# Patient Record
Sex: Female | Born: 1940
Health system: Southern US, Community
[De-identification: ages and names within clinical notes are randomized; demographics above are authoritative.]

## PROBLEM LIST (undated history)

## (undated) DIAGNOSIS — E782 Mixed hyperlipidemia: Secondary | ICD-10-CM

## (undated) DIAGNOSIS — E119 Type 2 diabetes mellitus without complications: Secondary | ICD-10-CM

## (undated) DIAGNOSIS — E039 Hypothyroidism, unspecified: Secondary | ICD-10-CM

## (undated) HISTORY — DX: Hypothyroidism, unspecified: E03.9

## (undated) HISTORY — DX: Type 2 diabetes mellitus without complications: E11.9

## (undated) HISTORY — PX: LAPAROSCOPIC CHOLECYSTECTOMY: SUR755

## (undated) HISTORY — DX: Mixed hyperlipidemia: E78.2

---

## 2014-11-09 DIAGNOSIS — H40013 Open angle with borderline findings, low risk, bilateral: Secondary | ICD-10-CM | POA: Diagnosis not present

## 2014-11-09 DIAGNOSIS — H2513 Age-related nuclear cataract, bilateral: Secondary | ICD-10-CM | POA: Diagnosis not present

## 2014-11-09 DIAGNOSIS — H04123 Dry eye syndrome of bilateral lacrimal glands: Secondary | ICD-10-CM | POA: Diagnosis not present

## 2014-11-09 DIAGNOSIS — E119 Type 2 diabetes mellitus without complications: Secondary | ICD-10-CM | POA: Diagnosis not present

## 2015-02-15 DIAGNOSIS — H40013 Open angle with borderline findings, low risk, bilateral: Secondary | ICD-10-CM | POA: Diagnosis not present

## 2015-04-28 DIAGNOSIS — I10 Essential (primary) hypertension: Secondary | ICD-10-CM | POA: Diagnosis not present

## 2015-04-28 DIAGNOSIS — E1165 Type 2 diabetes mellitus with hyperglycemia: Secondary | ICD-10-CM | POA: Diagnosis not present

## 2015-04-28 DIAGNOSIS — E039 Hypothyroidism, unspecified: Secondary | ICD-10-CM | POA: Diagnosis not present

## 2015-04-28 DIAGNOSIS — E119 Type 2 diabetes mellitus without complications: Secondary | ICD-10-CM | POA: Diagnosis not present

## 2015-06-22 DIAGNOSIS — H40013 Open angle with borderline findings, low risk, bilateral: Secondary | ICD-10-CM | POA: Diagnosis not present

## 2015-08-03 DIAGNOSIS — E039 Hypothyroidism, unspecified: Secondary | ICD-10-CM | POA: Diagnosis not present

## 2015-08-03 DIAGNOSIS — I10 Essential (primary) hypertension: Secondary | ICD-10-CM | POA: Diagnosis not present

## 2015-08-03 DIAGNOSIS — E1165 Type 2 diabetes mellitus with hyperglycemia: Secondary | ICD-10-CM | POA: Diagnosis not present

## 2015-08-07 DIAGNOSIS — Z6829 Body mass index (BMI) 29.0-29.9, adult: Secondary | ICD-10-CM | POA: Diagnosis not present

## 2015-08-07 DIAGNOSIS — Z2821 Immunization not carried out because of patient refusal: Secondary | ICD-10-CM | POA: Diagnosis not present

## 2015-08-07 DIAGNOSIS — E1165 Type 2 diabetes mellitus with hyperglycemia: Secondary | ICD-10-CM | POA: Diagnosis not present

## 2015-08-07 DIAGNOSIS — E039 Hypothyroidism, unspecified: Secondary | ICD-10-CM | POA: Diagnosis not present

## 2015-09-01 DIAGNOSIS — E782 Mixed hyperlipidemia: Secondary | ICD-10-CM | POA: Diagnosis not present

## 2015-09-01 DIAGNOSIS — E1165 Type 2 diabetes mellitus with hyperglycemia: Secondary | ICD-10-CM | POA: Diagnosis not present

## 2015-09-01 DIAGNOSIS — I10 Essential (primary) hypertension: Secondary | ICD-10-CM | POA: Diagnosis not present

## 2015-09-01 DIAGNOSIS — Z23 Encounter for immunization: Secondary | ICD-10-CM | POA: Diagnosis not present

## 2015-09-01 DIAGNOSIS — E039 Hypothyroidism, unspecified: Secondary | ICD-10-CM | POA: Diagnosis not present

## 2015-10-01 DIAGNOSIS — J019 Acute sinusitis, unspecified: Secondary | ICD-10-CM | POA: Diagnosis not present

## 2015-10-01 DIAGNOSIS — H9209 Otalgia, unspecified ear: Secondary | ICD-10-CM | POA: Diagnosis not present

## 2015-10-01 DIAGNOSIS — R030 Elevated blood-pressure reading, without diagnosis of hypertension: Secondary | ICD-10-CM | POA: Diagnosis not present

## 2015-10-01 DIAGNOSIS — M542 Cervicalgia: Secondary | ICD-10-CM | POA: Diagnosis not present

## 2015-10-05 DIAGNOSIS — R51 Headache: Secondary | ICD-10-CM | POA: Diagnosis not present

## 2015-10-12 DIAGNOSIS — E119 Type 2 diabetes mellitus without complications: Secondary | ICD-10-CM | POA: Diagnosis not present

## 2015-10-12 DIAGNOSIS — R5383 Other fatigue: Secondary | ICD-10-CM | POA: Diagnosis not present

## 2015-10-12 DIAGNOSIS — R51 Headache: Secondary | ICD-10-CM | POA: Diagnosis not present

## 2015-12-14 DIAGNOSIS — E782 Mixed hyperlipidemia: Secondary | ICD-10-CM | POA: Diagnosis not present

## 2015-12-14 DIAGNOSIS — E119 Type 2 diabetes mellitus without complications: Secondary | ICD-10-CM | POA: Diagnosis not present

## 2015-12-14 DIAGNOSIS — E039 Hypothyroidism, unspecified: Secondary | ICD-10-CM | POA: Diagnosis not present

## 2015-12-15 DIAGNOSIS — E782 Mixed hyperlipidemia: Secondary | ICD-10-CM | POA: Diagnosis not present

## 2015-12-15 DIAGNOSIS — E039 Hypothyroidism, unspecified: Secondary | ICD-10-CM | POA: Diagnosis not present

## 2015-12-15 DIAGNOSIS — E1165 Type 2 diabetes mellitus with hyperglycemia: Secondary | ICD-10-CM | POA: Diagnosis not present

## 2015-12-15 DIAGNOSIS — I10 Essential (primary) hypertension: Secondary | ICD-10-CM | POA: Diagnosis not present

## 2016-01-26 DIAGNOSIS — E1165 Type 2 diabetes mellitus with hyperglycemia: Secondary | ICD-10-CM | POA: Diagnosis not present

## 2016-01-26 DIAGNOSIS — E782 Mixed hyperlipidemia: Secondary | ICD-10-CM | POA: Diagnosis not present

## 2016-01-26 DIAGNOSIS — E039 Hypothyroidism, unspecified: Secondary | ICD-10-CM | POA: Diagnosis not present

## 2016-01-26 DIAGNOSIS — I10 Essential (primary) hypertension: Secondary | ICD-10-CM | POA: Diagnosis not present

## 2016-02-28 DIAGNOSIS — E119 Type 2 diabetes mellitus without complications: Secondary | ICD-10-CM | POA: Diagnosis not present

## 2016-02-28 DIAGNOSIS — H25813 Combined forms of age-related cataract, bilateral: Secondary | ICD-10-CM | POA: Diagnosis not present

## 2016-02-28 DIAGNOSIS — Z794 Long term (current) use of insulin: Secondary | ICD-10-CM | POA: Diagnosis not present

## 2016-02-28 DIAGNOSIS — H40013 Open angle with borderline findings, low risk, bilateral: Secondary | ICD-10-CM | POA: Diagnosis not present

## 2016-04-28 DIAGNOSIS — E782 Mixed hyperlipidemia: Secondary | ICD-10-CM | POA: Diagnosis not present

## 2016-04-28 DIAGNOSIS — E1165 Type 2 diabetes mellitus with hyperglycemia: Secondary | ICD-10-CM | POA: Diagnosis not present

## 2016-04-28 DIAGNOSIS — E039 Hypothyroidism, unspecified: Secondary | ICD-10-CM | POA: Diagnosis not present

## 2016-05-08 DIAGNOSIS — E039 Hypothyroidism, unspecified: Secondary | ICD-10-CM | POA: Diagnosis not present

## 2016-05-08 DIAGNOSIS — E119 Type 2 diabetes mellitus without complications: Secondary | ICD-10-CM | POA: Diagnosis not present

## 2016-05-08 DIAGNOSIS — I10 Essential (primary) hypertension: Secondary | ICD-10-CM | POA: Diagnosis not present

## 2016-05-08 DIAGNOSIS — E782 Mixed hyperlipidemia: Secondary | ICD-10-CM | POA: Diagnosis not present

## 2016-05-31 DIAGNOSIS — E1165 Type 2 diabetes mellitus with hyperglycemia: Secondary | ICD-10-CM | POA: Diagnosis not present

## 2016-05-31 DIAGNOSIS — I10 Essential (primary) hypertension: Secondary | ICD-10-CM | POA: Diagnosis not present

## 2016-07-17 DIAGNOSIS — M175 Other unilateral secondary osteoarthritis of knee: Secondary | ICD-10-CM | POA: Diagnosis not present

## 2016-07-24 DIAGNOSIS — M175 Other unilateral secondary osteoarthritis of knee: Secondary | ICD-10-CM | POA: Diagnosis not present

## 2016-08-16 DIAGNOSIS — E119 Type 2 diabetes mellitus without complications: Secondary | ICD-10-CM | POA: Diagnosis not present

## 2016-08-16 DIAGNOSIS — E039 Hypothyroidism, unspecified: Secondary | ICD-10-CM | POA: Diagnosis not present

## 2016-08-16 DIAGNOSIS — I10 Essential (primary) hypertension: Secondary | ICD-10-CM | POA: Diagnosis not present

## 2016-08-23 DIAGNOSIS — E039 Hypothyroidism, unspecified: Secondary | ICD-10-CM | POA: Diagnosis not present

## 2016-08-23 DIAGNOSIS — E782 Mixed hyperlipidemia: Secondary | ICD-10-CM | POA: Diagnosis not present

## 2016-08-23 DIAGNOSIS — E119 Type 2 diabetes mellitus without complications: Secondary | ICD-10-CM | POA: Diagnosis not present

## 2016-08-23 DIAGNOSIS — Z6829 Body mass index (BMI) 29.0-29.9, adult: Secondary | ICD-10-CM | POA: Diagnosis not present

## 2016-08-23 DIAGNOSIS — I10 Essential (primary) hypertension: Secondary | ICD-10-CM | POA: Diagnosis not present

## 2016-09-06 DIAGNOSIS — E119 Type 2 diabetes mellitus without complications: Secondary | ICD-10-CM | POA: Diagnosis not present

## 2016-09-06 DIAGNOSIS — I10 Essential (primary) hypertension: Secondary | ICD-10-CM | POA: Diagnosis not present

## 2016-09-06 DIAGNOSIS — E782 Mixed hyperlipidemia: Secondary | ICD-10-CM | POA: Diagnosis not present

## 2016-09-06 DIAGNOSIS — E039 Hypothyroidism, unspecified: Secondary | ICD-10-CM | POA: Diagnosis not present

## 2016-09-06 DIAGNOSIS — Z6829 Body mass index (BMI) 29.0-29.9, adult: Secondary | ICD-10-CM | POA: Diagnosis not present

## 2016-12-18 DIAGNOSIS — E119 Type 2 diabetes mellitus without complications: Secondary | ICD-10-CM | POA: Diagnosis not present

## 2016-12-18 DIAGNOSIS — E039 Hypothyroidism, unspecified: Secondary | ICD-10-CM | POA: Diagnosis not present

## 2016-12-18 DIAGNOSIS — I10 Essential (primary) hypertension: Secondary | ICD-10-CM | POA: Diagnosis not present

## 2016-12-18 DIAGNOSIS — E782 Mixed hyperlipidemia: Secondary | ICD-10-CM | POA: Diagnosis not present

## 2016-12-20 DIAGNOSIS — I10 Essential (primary) hypertension: Secondary | ICD-10-CM | POA: Diagnosis not present

## 2016-12-20 DIAGNOSIS — E782 Mixed hyperlipidemia: Secondary | ICD-10-CM | POA: Diagnosis not present

## 2016-12-20 DIAGNOSIS — Z6829 Body mass index (BMI) 29.0-29.9, adult: Secondary | ICD-10-CM | POA: Diagnosis not present

## 2016-12-20 DIAGNOSIS — E1165 Type 2 diabetes mellitus with hyperglycemia: Secondary | ICD-10-CM | POA: Diagnosis not present

## 2017-02-01 ENCOUNTER — Other Ambulatory Visit (HOSPITAL_COMMUNITY): Payer: Self-pay | Admitting: Internal Medicine

## 2017-02-01 DIAGNOSIS — Z0000 Encounter for general adult medical examination without abnormal findings: Secondary | ICD-10-CM | POA: Diagnosis not present

## 2017-02-01 DIAGNOSIS — Z1231 Encounter for screening mammogram for malignant neoplasm of breast: Secondary | ICD-10-CM

## 2017-02-12 ENCOUNTER — Ambulatory Visit (HOSPITAL_COMMUNITY): Payer: Self-pay

## 2017-02-12 ENCOUNTER — Ambulatory Visit (HOSPITAL_COMMUNITY)
Admission: RE | Admit: 2017-02-12 | Discharge: 2017-02-12 | Disposition: A | Discharge status: Home / Self Care | Payer: Medicare PPO | Source: Ambulatory Visit | Attending: Internal Medicine | Admitting: Internal Medicine

## 2017-02-12 DIAGNOSIS — Z1231 Encounter for screening mammogram for malignant neoplasm of breast: Secondary | ICD-10-CM | POA: Insufficient documentation

## 2017-03-14 DIAGNOSIS — E039 Hypothyroidism, unspecified: Secondary | ICD-10-CM | POA: Diagnosis not present

## 2017-03-14 DIAGNOSIS — E1165 Type 2 diabetes mellitus with hyperglycemia: Secondary | ICD-10-CM | POA: Diagnosis not present

## 2017-03-14 DIAGNOSIS — E782 Mixed hyperlipidemia: Secondary | ICD-10-CM | POA: Diagnosis not present

## 2017-03-21 DIAGNOSIS — E1165 Type 2 diabetes mellitus with hyperglycemia: Secondary | ICD-10-CM | POA: Diagnosis not present

## 2017-03-21 DIAGNOSIS — E039 Hypothyroidism, unspecified: Secondary | ICD-10-CM | POA: Diagnosis not present

## 2017-03-21 DIAGNOSIS — E782 Mixed hyperlipidemia: Secondary | ICD-10-CM | POA: Diagnosis not present

## 2017-03-21 DIAGNOSIS — I10 Essential (primary) hypertension: Secondary | ICD-10-CM | POA: Diagnosis not present

## 2017-03-21 DIAGNOSIS — Z6830 Body mass index (BMI) 30.0-30.9, adult: Secondary | ICD-10-CM | POA: Diagnosis not present

## 2017-03-23 DIAGNOSIS — H25813 Combined forms of age-related cataract, bilateral: Secondary | ICD-10-CM | POA: Diagnosis not present

## 2017-03-23 DIAGNOSIS — H40013 Open angle with borderline findings, low risk, bilateral: Secondary | ICD-10-CM | POA: Diagnosis not present

## 2017-03-23 DIAGNOSIS — E119 Type 2 diabetes mellitus without complications: Secondary | ICD-10-CM | POA: Diagnosis not present

## 2017-03-23 DIAGNOSIS — Z794 Long term (current) use of insulin: Secondary | ICD-10-CM | POA: Diagnosis not present

## 2017-05-02 DIAGNOSIS — E782 Mixed hyperlipidemia: Secondary | ICD-10-CM | POA: Diagnosis not present

## 2017-05-02 DIAGNOSIS — E039 Hypothyroidism, unspecified: Secondary | ICD-10-CM | POA: Diagnosis not present

## 2017-05-02 DIAGNOSIS — E1165 Type 2 diabetes mellitus with hyperglycemia: Secondary | ICD-10-CM | POA: Diagnosis not present

## 2017-05-02 DIAGNOSIS — Z6830 Body mass index (BMI) 30.0-30.9, adult: Secondary | ICD-10-CM | POA: Diagnosis not present

## 2017-05-02 DIAGNOSIS — I10 Essential (primary) hypertension: Secondary | ICD-10-CM | POA: Diagnosis not present

## 2017-05-31 DIAGNOSIS — E1165 Type 2 diabetes mellitus with hyperglycemia: Secondary | ICD-10-CM | POA: Diagnosis not present

## 2017-05-31 DIAGNOSIS — Z6830 Body mass index (BMI) 30.0-30.9, adult: Secondary | ICD-10-CM | POA: Diagnosis not present

## 2017-07-11 DIAGNOSIS — E1165 Type 2 diabetes mellitus with hyperglycemia: Secondary | ICD-10-CM | POA: Diagnosis not present

## 2017-07-11 DIAGNOSIS — Z6830 Body mass index (BMI) 30.0-30.9, adult: Secondary | ICD-10-CM | POA: Diagnosis not present

## 2017-07-11 DIAGNOSIS — E162 Hypoglycemia, unspecified: Secondary | ICD-10-CM | POA: Diagnosis not present

## 2017-08-30 DIAGNOSIS — E039 Hypothyroidism, unspecified: Secondary | ICD-10-CM | POA: Diagnosis not present

## 2017-08-30 DIAGNOSIS — E1165 Type 2 diabetes mellitus with hyperglycemia: Secondary | ICD-10-CM | POA: Diagnosis not present

## 2017-08-30 DIAGNOSIS — E782 Mixed hyperlipidemia: Secondary | ICD-10-CM | POA: Diagnosis not present

## 2017-08-30 DIAGNOSIS — I10 Essential (primary) hypertension: Secondary | ICD-10-CM | POA: Diagnosis not present

## 2017-09-12 DIAGNOSIS — E1165 Type 2 diabetes mellitus with hyperglycemia: Secondary | ICD-10-CM | POA: Diagnosis not present

## 2017-09-12 DIAGNOSIS — Z6830 Body mass index (BMI) 30.0-30.9, adult: Secondary | ICD-10-CM | POA: Diagnosis not present

## 2017-09-12 DIAGNOSIS — E039 Hypothyroidism, unspecified: Secondary | ICD-10-CM | POA: Diagnosis not present

## 2017-12-28 DIAGNOSIS — E782 Mixed hyperlipidemia: Secondary | ICD-10-CM | POA: Diagnosis not present

## 2017-12-28 DIAGNOSIS — E039 Hypothyroidism, unspecified: Secondary | ICD-10-CM | POA: Diagnosis not present

## 2017-12-28 DIAGNOSIS — E1165 Type 2 diabetes mellitus with hyperglycemia: Secondary | ICD-10-CM | POA: Diagnosis not present

## 2017-12-28 DIAGNOSIS — I10 Essential (primary) hypertension: Secondary | ICD-10-CM | POA: Diagnosis not present

## 2018-01-02 DIAGNOSIS — E039 Hypothyroidism, unspecified: Secondary | ICD-10-CM | POA: Diagnosis not present

## 2018-01-02 DIAGNOSIS — E782 Mixed hyperlipidemia: Secondary | ICD-10-CM | POA: Diagnosis not present

## 2018-01-02 DIAGNOSIS — I10 Essential (primary) hypertension: Secondary | ICD-10-CM | POA: Diagnosis not present

## 2018-01-02 DIAGNOSIS — Z6829 Body mass index (BMI) 29.0-29.9, adult: Secondary | ICD-10-CM | POA: Diagnosis not present

## 2018-01-02 DIAGNOSIS — E1165 Type 2 diabetes mellitus with hyperglycemia: Secondary | ICD-10-CM | POA: Diagnosis not present

## 2018-03-25 DIAGNOSIS — H40013 Open angle with borderline findings, low risk, bilateral: Secondary | ICD-10-CM | POA: Diagnosis not present

## 2018-03-25 DIAGNOSIS — E119 Type 2 diabetes mellitus without complications: Secondary | ICD-10-CM | POA: Diagnosis not present

## 2018-03-25 DIAGNOSIS — H25813 Combined forms of age-related cataract, bilateral: Secondary | ICD-10-CM | POA: Diagnosis not present

## 2018-03-25 DIAGNOSIS — Z794 Long term (current) use of insulin: Secondary | ICD-10-CM | POA: Diagnosis not present

## 2018-04-10 DIAGNOSIS — E119 Type 2 diabetes mellitus without complications: Secondary | ICD-10-CM | POA: Diagnosis not present

## 2018-04-10 DIAGNOSIS — E039 Hypothyroidism, unspecified: Secondary | ICD-10-CM | POA: Diagnosis not present

## 2018-04-10 DIAGNOSIS — E782 Mixed hyperlipidemia: Secondary | ICD-10-CM | POA: Diagnosis not present

## 2018-04-16 DIAGNOSIS — E1165 Type 2 diabetes mellitus with hyperglycemia: Secondary | ICD-10-CM | POA: Diagnosis not present

## 2018-04-16 DIAGNOSIS — E782 Mixed hyperlipidemia: Secondary | ICD-10-CM | POA: Diagnosis not present

## 2018-04-16 DIAGNOSIS — Z6830 Body mass index (BMI) 30.0-30.9, adult: Secondary | ICD-10-CM | POA: Diagnosis not present

## 2018-04-16 DIAGNOSIS — E039 Hypothyroidism, unspecified: Secondary | ICD-10-CM | POA: Diagnosis not present

## 2018-06-21 DIAGNOSIS — I10 Essential (primary) hypertension: Secondary | ICD-10-CM | POA: Diagnosis not present

## 2018-06-21 DIAGNOSIS — E782 Mixed hyperlipidemia: Secondary | ICD-10-CM | POA: Diagnosis not present

## 2018-06-21 DIAGNOSIS — E039 Hypothyroidism, unspecified: Secondary | ICD-10-CM | POA: Diagnosis not present

## 2018-06-21 DIAGNOSIS — E1165 Type 2 diabetes mellitus with hyperglycemia: Secondary | ICD-10-CM | POA: Diagnosis not present

## 2018-07-01 DIAGNOSIS — I10 Essential (primary) hypertension: Secondary | ICD-10-CM | POA: Diagnosis not present

## 2018-07-01 DIAGNOSIS — E039 Hypothyroidism, unspecified: Secondary | ICD-10-CM | POA: Diagnosis not present

## 2018-07-01 DIAGNOSIS — E1165 Type 2 diabetes mellitus with hyperglycemia: Secondary | ICD-10-CM | POA: Diagnosis not present

## 2018-07-01 DIAGNOSIS — E782 Mixed hyperlipidemia: Secondary | ICD-10-CM | POA: Diagnosis not present

## 2018-07-17 DIAGNOSIS — E1165 Type 2 diabetes mellitus with hyperglycemia: Secondary | ICD-10-CM | POA: Diagnosis not present

## 2018-07-17 DIAGNOSIS — I10 Essential (primary) hypertension: Secondary | ICD-10-CM | POA: Diagnosis not present

## 2018-07-17 DIAGNOSIS — E039 Hypothyroidism, unspecified: Secondary | ICD-10-CM | POA: Diagnosis not present

## 2018-07-17 DIAGNOSIS — E782 Mixed hyperlipidemia: Secondary | ICD-10-CM | POA: Diagnosis not present

## 2018-07-22 DIAGNOSIS — Z0000 Encounter for general adult medical examination without abnormal findings: Secondary | ICD-10-CM | POA: Diagnosis not present

## 2018-07-22 DIAGNOSIS — E782 Mixed hyperlipidemia: Secondary | ICD-10-CM | POA: Diagnosis not present

## 2018-07-22 DIAGNOSIS — E039 Hypothyroidism, unspecified: Secondary | ICD-10-CM | POA: Diagnosis not present

## 2018-07-22 DIAGNOSIS — Z6830 Body mass index (BMI) 30.0-30.9, adult: Secondary | ICD-10-CM | POA: Diagnosis not present

## 2018-07-22 DIAGNOSIS — E1165 Type 2 diabetes mellitus with hyperglycemia: Secondary | ICD-10-CM | POA: Diagnosis not present

## 2018-08-08 DIAGNOSIS — E669 Obesity, unspecified: Secondary | ICD-10-CM | POA: Diagnosis not present

## 2018-08-08 DIAGNOSIS — I10 Essential (primary) hypertension: Secondary | ICD-10-CM | POA: Diagnosis not present

## 2018-08-08 DIAGNOSIS — E1165 Type 2 diabetes mellitus with hyperglycemia: Secondary | ICD-10-CM | POA: Diagnosis not present

## 2018-08-08 DIAGNOSIS — E785 Hyperlipidemia, unspecified: Secondary | ICD-10-CM | POA: Diagnosis not present

## 2018-08-08 DIAGNOSIS — Z6830 Body mass index (BMI) 30.0-30.9, adult: Secondary | ICD-10-CM | POA: Diagnosis not present

## 2018-08-08 DIAGNOSIS — E039 Hypothyroidism, unspecified: Secondary | ICD-10-CM | POA: Diagnosis not present

## 2018-09-05 DIAGNOSIS — E1165 Type 2 diabetes mellitus with hyperglycemia: Secondary | ICD-10-CM | POA: Diagnosis not present

## 2018-09-05 DIAGNOSIS — E039 Hypothyroidism, unspecified: Secondary | ICD-10-CM | POA: Diagnosis not present

## 2018-09-05 DIAGNOSIS — E782 Mixed hyperlipidemia: Secondary | ICD-10-CM | POA: Diagnosis not present

## 2018-09-05 DIAGNOSIS — I10 Essential (primary) hypertension: Secondary | ICD-10-CM | POA: Diagnosis not present

## 2018-10-24 DIAGNOSIS — I10 Essential (primary) hypertension: Secondary | ICD-10-CM | POA: Diagnosis not present

## 2018-10-24 DIAGNOSIS — E1165 Type 2 diabetes mellitus with hyperglycemia: Secondary | ICD-10-CM | POA: Diagnosis not present

## 2018-10-24 DIAGNOSIS — E782 Mixed hyperlipidemia: Secondary | ICD-10-CM | POA: Diagnosis not present

## 2018-10-24 DIAGNOSIS — E039 Hypothyroidism, unspecified: Secondary | ICD-10-CM | POA: Diagnosis not present

## 2018-10-30 DIAGNOSIS — E782 Mixed hyperlipidemia: Secondary | ICD-10-CM | POA: Diagnosis not present

## 2018-10-30 DIAGNOSIS — E039 Hypothyroidism, unspecified: Secondary | ICD-10-CM | POA: Diagnosis not present

## 2018-10-30 DIAGNOSIS — E1165 Type 2 diabetes mellitus with hyperglycemia: Secondary | ICD-10-CM | POA: Diagnosis not present

## 2018-10-30 DIAGNOSIS — I10 Essential (primary) hypertension: Secondary | ICD-10-CM | POA: Diagnosis not present

## 2018-11-05 ENCOUNTER — Other Ambulatory Visit (HOSPITAL_COMMUNITY): Payer: Self-pay | Admitting: Internal Medicine

## 2018-11-05 DIAGNOSIS — M546 Pain in thoracic spine: Secondary | ICD-10-CM | POA: Diagnosis not present

## 2018-11-05 DIAGNOSIS — E782 Mixed hyperlipidemia: Secondary | ICD-10-CM | POA: Diagnosis not present

## 2018-11-05 DIAGNOSIS — E1165 Type 2 diabetes mellitus with hyperglycemia: Secondary | ICD-10-CM | POA: Diagnosis not present

## 2018-11-05 DIAGNOSIS — E039 Hypothyroidism, unspecified: Secondary | ICD-10-CM | POA: Diagnosis not present

## 2018-11-05 DIAGNOSIS — Z1231 Encounter for screening mammogram for malignant neoplasm of breast: Secondary | ICD-10-CM

## 2018-11-05 DIAGNOSIS — R739 Hyperglycemia, unspecified: Secondary | ICD-10-CM | POA: Diagnosis not present

## 2018-11-05 DIAGNOSIS — Z780 Asymptomatic menopausal state: Secondary | ICD-10-CM

## 2018-11-28 DIAGNOSIS — R739 Hyperglycemia, unspecified: Secondary | ICD-10-CM | POA: Diagnosis not present

## 2018-11-28 DIAGNOSIS — E039 Hypothyroidism, unspecified: Secondary | ICD-10-CM | POA: Diagnosis not present

## 2018-11-28 DIAGNOSIS — E782 Mixed hyperlipidemia: Secondary | ICD-10-CM | POA: Diagnosis not present

## 2018-11-28 DIAGNOSIS — E1165 Type 2 diabetes mellitus with hyperglycemia: Secondary | ICD-10-CM | POA: Diagnosis not present

## 2018-12-16 ENCOUNTER — Other Ambulatory Visit (HOSPITAL_COMMUNITY): Payer: Medicare PPO

## 2018-12-16 ENCOUNTER — Ambulatory Visit (HOSPITAL_COMMUNITY): Payer: Medicare PPO

## 2018-12-19 ENCOUNTER — Ambulatory Visit (HOSPITAL_COMMUNITY): Payer: Medicare PPO

## 2018-12-19 ENCOUNTER — Inpatient Hospital Stay (HOSPITAL_COMMUNITY): Admission: RE | Admit: 2018-12-19 | Payer: Medicare PPO | Source: Ambulatory Visit

## 2018-12-23 ENCOUNTER — Ambulatory Visit (HOSPITAL_COMMUNITY)
Admission: RE | Admit: 2018-12-23 | Discharge: 2018-12-23 | Disposition: A | Discharge status: Home / Self Care | Payer: Medicare PPO | Source: Ambulatory Visit | Attending: Internal Medicine | Admitting: Internal Medicine

## 2018-12-23 DIAGNOSIS — Z1231 Encounter for screening mammogram for malignant neoplasm of breast: Secondary | ICD-10-CM | POA: Diagnosis not present

## 2018-12-23 DIAGNOSIS — Z780 Asymptomatic menopausal state: Secondary | ICD-10-CM

## 2018-12-23 DIAGNOSIS — M85852 Other specified disorders of bone density and structure, left thigh: Secondary | ICD-10-CM | POA: Diagnosis not present

## 2019-02-20 DIAGNOSIS — Z0000 Encounter for general adult medical examination without abnormal findings: Secondary | ICD-10-CM | POA: Diagnosis not present

## 2019-05-14 DIAGNOSIS — E1165 Type 2 diabetes mellitus with hyperglycemia: Secondary | ICD-10-CM | POA: Diagnosis not present

## 2019-05-27 DIAGNOSIS — E782 Mixed hyperlipidemia: Secondary | ICD-10-CM | POA: Diagnosis not present

## 2019-05-27 DIAGNOSIS — I10 Essential (primary) hypertension: Secondary | ICD-10-CM | POA: Diagnosis not present

## 2019-05-27 DIAGNOSIS — E039 Hypothyroidism, unspecified: Secondary | ICD-10-CM | POA: Diagnosis not present

## 2019-05-27 DIAGNOSIS — E1165 Type 2 diabetes mellitus with hyperglycemia: Secondary | ICD-10-CM | POA: Diagnosis not present

## 2019-05-29 DIAGNOSIS — Z8639 Personal history of other endocrine, nutritional and metabolic disease: Secondary | ICD-10-CM | POA: Diagnosis not present

## 2019-05-29 DIAGNOSIS — E782 Mixed hyperlipidemia: Secondary | ICD-10-CM | POA: Diagnosis not present

## 2019-05-29 DIAGNOSIS — E039 Hypothyroidism, unspecified: Secondary | ICD-10-CM | POA: Diagnosis not present

## 2019-05-29 DIAGNOSIS — E1165 Type 2 diabetes mellitus with hyperglycemia: Secondary | ICD-10-CM | POA: Diagnosis not present

## 2019-07-08 DIAGNOSIS — E039 Hypothyroidism, unspecified: Secondary | ICD-10-CM | POA: Diagnosis not present

## 2019-07-08 DIAGNOSIS — E782 Mixed hyperlipidemia: Secondary | ICD-10-CM | POA: Diagnosis not present

## 2019-07-08 DIAGNOSIS — Z8639 Personal history of other endocrine, nutritional and metabolic disease: Secondary | ICD-10-CM | POA: Diagnosis not present

## 2019-07-08 DIAGNOSIS — E1165 Type 2 diabetes mellitus with hyperglycemia: Secondary | ICD-10-CM | POA: Diagnosis not present

## 2019-08-12 DIAGNOSIS — E782 Mixed hyperlipidemia: Secondary | ICD-10-CM | POA: Diagnosis not present

## 2019-08-12 DIAGNOSIS — Z8639 Personal history of other endocrine, nutritional and metabolic disease: Secondary | ICD-10-CM | POA: Diagnosis not present

## 2019-08-12 DIAGNOSIS — E1165 Type 2 diabetes mellitus with hyperglycemia: Secondary | ICD-10-CM | POA: Diagnosis not present

## 2019-08-12 DIAGNOSIS — E039 Hypothyroidism, unspecified: Secondary | ICD-10-CM | POA: Diagnosis not present

## 2019-09-02 DIAGNOSIS — I10 Essential (primary) hypertension: Secondary | ICD-10-CM | POA: Diagnosis not present

## 2019-09-02 DIAGNOSIS — R739 Hyperglycemia, unspecified: Secondary | ICD-10-CM | POA: Diagnosis not present

## 2019-09-02 DIAGNOSIS — E1165 Type 2 diabetes mellitus with hyperglycemia: Secondary | ICD-10-CM | POA: Diagnosis not present

## 2019-09-02 DIAGNOSIS — E039 Hypothyroidism, unspecified: Secondary | ICD-10-CM | POA: Diagnosis not present

## 2019-09-02 DIAGNOSIS — E782 Mixed hyperlipidemia: Secondary | ICD-10-CM | POA: Diagnosis not present

## 2019-09-10 DIAGNOSIS — E782 Mixed hyperlipidemia: Secondary | ICD-10-CM | POA: Diagnosis not present

## 2019-09-10 DIAGNOSIS — Z2821 Immunization not carried out because of patient refusal: Secondary | ICD-10-CM | POA: Diagnosis not present

## 2019-09-10 DIAGNOSIS — E039 Hypothyroidism, unspecified: Secondary | ICD-10-CM | POA: Diagnosis not present

## 2019-09-10 DIAGNOSIS — Z8639 Personal history of other endocrine, nutritional and metabolic disease: Secondary | ICD-10-CM | POA: Diagnosis not present

## 2019-09-10 DIAGNOSIS — E1165 Type 2 diabetes mellitus with hyperglycemia: Secondary | ICD-10-CM | POA: Diagnosis not present

## 2019-10-22 DIAGNOSIS — R350 Frequency of micturition: Secondary | ICD-10-CM | POA: Diagnosis not present

## 2019-10-22 DIAGNOSIS — R8281 Pyuria: Secondary | ICD-10-CM | POA: Diagnosis not present

## 2019-11-20 DIAGNOSIS — H25813 Combined forms of age-related cataract, bilateral: Secondary | ICD-10-CM | POA: Diagnosis not present

## 2019-11-20 DIAGNOSIS — H40013 Open angle with borderline findings, low risk, bilateral: Secondary | ICD-10-CM | POA: Diagnosis not present

## 2019-11-20 DIAGNOSIS — E119 Type 2 diabetes mellitus without complications: Secondary | ICD-10-CM | POA: Diagnosis not present

## 2019-11-20 DIAGNOSIS — Z794 Long term (current) use of insulin: Secondary | ICD-10-CM | POA: Diagnosis not present

## 2019-12-10 DIAGNOSIS — R300 Dysuria: Secondary | ICD-10-CM | POA: Diagnosis not present

## 2019-12-22 DIAGNOSIS — Z0000 Encounter for general adult medical examination without abnormal findings: Secondary | ICD-10-CM | POA: Diagnosis not present

## 2019-12-22 DIAGNOSIS — N390 Urinary tract infection, site not specified: Secondary | ICD-10-CM | POA: Diagnosis not present

## 2019-12-22 DIAGNOSIS — E782 Mixed hyperlipidemia: Secondary | ICD-10-CM | POA: Diagnosis not present

## 2019-12-22 DIAGNOSIS — Z6829 Body mass index (BMI) 29.0-29.9, adult: Secondary | ICD-10-CM | POA: Diagnosis not present

## 2019-12-22 DIAGNOSIS — E039 Hypothyroidism, unspecified: Secondary | ICD-10-CM | POA: Diagnosis not present

## 2019-12-22 DIAGNOSIS — R7301 Impaired fasting glucose: Secondary | ICD-10-CM | POA: Diagnosis not present

## 2019-12-22 DIAGNOSIS — M546 Pain in thoracic spine: Secondary | ICD-10-CM | POA: Diagnosis not present

## 2019-12-22 DIAGNOSIS — Z712 Person consulting for explanation of examination or test findings: Secondary | ICD-10-CM | POA: Diagnosis not present

## 2019-12-22 DIAGNOSIS — Z2821 Immunization not carried out because of patient refusal: Secondary | ICD-10-CM | POA: Diagnosis not present

## 2019-12-22 DIAGNOSIS — Z6830 Body mass index (BMI) 30.0-30.9, adult: Secondary | ICD-10-CM | POA: Diagnosis not present

## 2019-12-22 DIAGNOSIS — I10 Essential (primary) hypertension: Secondary | ICD-10-CM | POA: Diagnosis not present

## 2019-12-25 DIAGNOSIS — Z8639 Personal history of other endocrine, nutritional and metabolic disease: Secondary | ICD-10-CM | POA: Diagnosis not present

## 2019-12-25 DIAGNOSIS — N390 Urinary tract infection, site not specified: Secondary | ICD-10-CM | POA: Diagnosis not present

## 2019-12-25 DIAGNOSIS — E782 Mixed hyperlipidemia: Secondary | ICD-10-CM | POA: Diagnosis not present

## 2019-12-25 DIAGNOSIS — E1165 Type 2 diabetes mellitus with hyperglycemia: Secondary | ICD-10-CM | POA: Diagnosis not present

## 2019-12-25 DIAGNOSIS — E039 Hypothyroidism, unspecified: Secondary | ICD-10-CM | POA: Diagnosis not present

## 2020-02-19 ENCOUNTER — Other Ambulatory Visit (HOSPITAL_COMMUNITY): Payer: Self-pay | Admitting: Internal Medicine

## 2020-02-19 DIAGNOSIS — Z1231 Encounter for screening mammogram for malignant neoplasm of breast: Secondary | ICD-10-CM

## 2020-04-28 DIAGNOSIS — I10 Essential (primary) hypertension: Secondary | ICD-10-CM | POA: Diagnosis not present

## 2020-04-28 DIAGNOSIS — Z0000 Encounter for general adult medical examination without abnormal findings: Secondary | ICD-10-CM | POA: Diagnosis not present

## 2020-04-28 DIAGNOSIS — M546 Pain in thoracic spine: Secondary | ICD-10-CM | POA: Diagnosis not present

## 2020-04-28 DIAGNOSIS — Z6830 Body mass index (BMI) 30.0-30.9, adult: Secondary | ICD-10-CM | POA: Diagnosis not present

## 2020-04-28 DIAGNOSIS — E1165 Type 2 diabetes mellitus with hyperglycemia: Secondary | ICD-10-CM | POA: Diagnosis not present

## 2020-04-28 DIAGNOSIS — Z6829 Body mass index (BMI) 29.0-29.9, adult: Secondary | ICD-10-CM | POA: Diagnosis not present

## 2020-04-28 DIAGNOSIS — Z2821 Immunization not carried out because of patient refusal: Secondary | ICD-10-CM | POA: Diagnosis not present

## 2020-04-28 DIAGNOSIS — E782 Mixed hyperlipidemia: Secondary | ICD-10-CM | POA: Diagnosis not present

## 2020-04-28 DIAGNOSIS — E039 Hypothyroidism, unspecified: Secondary | ICD-10-CM | POA: Diagnosis not present

## 2020-04-28 DIAGNOSIS — Z712 Person consulting for explanation of examination or test findings: Secondary | ICD-10-CM | POA: Diagnosis not present

## 2020-05-04 DIAGNOSIS — E039 Hypothyroidism, unspecified: Secondary | ICD-10-CM | POA: Diagnosis not present

## 2020-05-04 DIAGNOSIS — E782 Mixed hyperlipidemia: Secondary | ICD-10-CM | POA: Diagnosis not present

## 2020-05-04 DIAGNOSIS — E1165 Type 2 diabetes mellitus with hyperglycemia: Secondary | ICD-10-CM | POA: Diagnosis not present

## 2020-05-04 DIAGNOSIS — Z8639 Personal history of other endocrine, nutritional and metabolic disease: Secondary | ICD-10-CM | POA: Diagnosis not present

## 2020-05-05 ENCOUNTER — Other Ambulatory Visit: Payer: Self-pay

## 2020-05-05 ENCOUNTER — Ambulatory Visit (HOSPITAL_COMMUNITY)
Admission: RE | Admit: 2020-05-05 | Discharge: 2020-05-05 | Disposition: A | Discharge status: Home / Self Care | Payer: Medicare PPO | Source: Ambulatory Visit | Attending: Internal Medicine | Admitting: Internal Medicine

## 2020-05-05 DIAGNOSIS — Z1231 Encounter for screening mammogram for malignant neoplasm of breast: Secondary | ICD-10-CM | POA: Diagnosis not present

## 2020-05-31 DIAGNOSIS — H40023 Open angle with borderline findings, high risk, bilateral: Secondary | ICD-10-CM | POA: Diagnosis not present

## 2020-05-31 DIAGNOSIS — H25813 Combined forms of age-related cataract, bilateral: Secondary | ICD-10-CM | POA: Diagnosis not present

## 2020-08-12 DIAGNOSIS — Z6829 Body mass index (BMI) 29.0-29.9, adult: Secondary | ICD-10-CM | POA: Diagnosis not present

## 2020-08-12 DIAGNOSIS — Z2821 Immunization not carried out because of patient refusal: Secondary | ICD-10-CM | POA: Diagnosis not present

## 2020-08-12 DIAGNOSIS — E782 Mixed hyperlipidemia: Secondary | ICD-10-CM | POA: Diagnosis not present

## 2020-08-12 DIAGNOSIS — Z6830 Body mass index (BMI) 30.0-30.9, adult: Secondary | ICD-10-CM | POA: Diagnosis not present

## 2020-08-12 DIAGNOSIS — Z0000 Encounter for general adult medical examination without abnormal findings: Secondary | ICD-10-CM | POA: Diagnosis not present

## 2020-08-12 DIAGNOSIS — I10 Essential (primary) hypertension: Secondary | ICD-10-CM | POA: Diagnosis not present

## 2020-08-12 DIAGNOSIS — Z712 Person consulting for explanation of examination or test findings: Secondary | ICD-10-CM | POA: Diagnosis not present

## 2020-08-12 DIAGNOSIS — M546 Pain in thoracic spine: Secondary | ICD-10-CM | POA: Diagnosis not present

## 2020-08-12 DIAGNOSIS — E039 Hypothyroidism, unspecified: Secondary | ICD-10-CM | POA: Diagnosis not present

## 2020-08-17 DIAGNOSIS — E1165 Type 2 diabetes mellitus with hyperglycemia: Secondary | ICD-10-CM | POA: Diagnosis not present

## 2020-08-17 DIAGNOSIS — Z8639 Personal history of other endocrine, nutritional and metabolic disease: Secondary | ICD-10-CM | POA: Diagnosis not present

## 2020-08-17 DIAGNOSIS — Z712 Person consulting for explanation of examination or test findings: Secondary | ICD-10-CM | POA: Diagnosis not present

## 2020-08-17 DIAGNOSIS — N390 Urinary tract infection, site not specified: Secondary | ICD-10-CM | POA: Diagnosis not present

## 2020-08-17 DIAGNOSIS — E039 Hypothyroidism, unspecified: Secondary | ICD-10-CM | POA: Diagnosis not present

## 2020-08-17 DIAGNOSIS — E782 Mixed hyperlipidemia: Secondary | ICD-10-CM | POA: Diagnosis not present

## 2020-08-17 DIAGNOSIS — Z2821 Immunization not carried out because of patient refusal: Secondary | ICD-10-CM | POA: Diagnosis not present

## 2020-11-16 DIAGNOSIS — H5210 Myopia, unspecified eye: Secondary | ICD-10-CM | POA: Diagnosis not present

## 2020-11-16 DIAGNOSIS — E119 Type 2 diabetes mellitus without complications: Secondary | ICD-10-CM | POA: Diagnosis not present

## 2020-11-16 DIAGNOSIS — H04123 Dry eye syndrome of bilateral lacrimal glands: Secondary | ICD-10-CM | POA: Diagnosis not present

## 2020-11-16 DIAGNOSIS — H52209 Unspecified astigmatism, unspecified eye: Secondary | ICD-10-CM | POA: Diagnosis not present

## 2020-11-23 DIAGNOSIS — Z6829 Body mass index (BMI) 29.0-29.9, adult: Secondary | ICD-10-CM | POA: Diagnosis not present

## 2020-11-23 DIAGNOSIS — Z0000 Encounter for general adult medical examination without abnormal findings: Secondary | ICD-10-CM | POA: Diagnosis not present

## 2020-11-23 DIAGNOSIS — Z712 Person consulting for explanation of examination or test findings: Secondary | ICD-10-CM | POA: Diagnosis not present

## 2020-11-23 DIAGNOSIS — E782 Mixed hyperlipidemia: Secondary | ICD-10-CM | POA: Diagnosis not present

## 2020-11-23 DIAGNOSIS — Z2821 Immunization not carried out because of patient refusal: Secondary | ICD-10-CM | POA: Diagnosis not present

## 2020-11-23 DIAGNOSIS — E039 Hypothyroidism, unspecified: Secondary | ICD-10-CM | POA: Diagnosis not present

## 2020-11-23 DIAGNOSIS — M546 Pain in thoracic spine: Secondary | ICD-10-CM | POA: Diagnosis not present

## 2020-11-23 DIAGNOSIS — I10 Essential (primary) hypertension: Secondary | ICD-10-CM | POA: Diagnosis not present

## 2020-11-23 DIAGNOSIS — Z6830 Body mass index (BMI) 30.0-30.9, adult: Secondary | ICD-10-CM | POA: Diagnosis not present

## 2020-11-29 DIAGNOSIS — E039 Hypothyroidism, unspecified: Secondary | ICD-10-CM | POA: Diagnosis not present

## 2020-11-29 DIAGNOSIS — N390 Urinary tract infection, site not specified: Secondary | ICD-10-CM | POA: Diagnosis not present

## 2020-11-29 DIAGNOSIS — Z8639 Personal history of other endocrine, nutritional and metabolic disease: Secondary | ICD-10-CM | POA: Diagnosis not present

## 2020-11-29 DIAGNOSIS — E782 Mixed hyperlipidemia: Secondary | ICD-10-CM | POA: Diagnosis not present

## 2020-11-29 DIAGNOSIS — Z2821 Immunization not carried out because of patient refusal: Secondary | ICD-10-CM | POA: Diagnosis not present

## 2020-11-29 DIAGNOSIS — E1165 Type 2 diabetes mellitus with hyperglycemia: Secondary | ICD-10-CM | POA: Diagnosis not present

## 2021-02-22 DIAGNOSIS — E1165 Type 2 diabetes mellitus with hyperglycemia: Secondary | ICD-10-CM | POA: Diagnosis not present

## 2021-02-22 DIAGNOSIS — R739 Hyperglycemia, unspecified: Secondary | ICD-10-CM | POA: Diagnosis not present

## 2021-02-22 DIAGNOSIS — E782 Mixed hyperlipidemia: Secondary | ICD-10-CM | POA: Diagnosis not present

## 2021-02-22 DIAGNOSIS — E039 Hypothyroidism, unspecified: Secondary | ICD-10-CM | POA: Diagnosis not present

## 2021-02-22 DIAGNOSIS — I10 Essential (primary) hypertension: Secondary | ICD-10-CM | POA: Diagnosis not present

## 2021-02-28 DIAGNOSIS — R809 Proteinuria, unspecified: Secondary | ICD-10-CM | POA: Diagnosis not present

## 2021-02-28 DIAGNOSIS — E039 Hypothyroidism, unspecified: Secondary | ICD-10-CM | POA: Diagnosis not present

## 2021-02-28 DIAGNOSIS — Z1231 Encounter for screening mammogram for malignant neoplasm of breast: Secondary | ICD-10-CM | POA: Diagnosis not present

## 2021-02-28 DIAGNOSIS — E782 Mixed hyperlipidemia: Secondary | ICD-10-CM | POA: Diagnosis not present

## 2021-02-28 DIAGNOSIS — E1165 Type 2 diabetes mellitus with hyperglycemia: Secondary | ICD-10-CM | POA: Diagnosis not present

## 2021-02-28 DIAGNOSIS — M8580 Other specified disorders of bone density and structure, unspecified site: Secondary | ICD-10-CM | POA: Diagnosis not present

## 2021-03-17 DIAGNOSIS — Z9181 History of falling: Secondary | ICD-10-CM | POA: Diagnosis not present

## 2021-03-17 DIAGNOSIS — S91311A Laceration without foreign body, right foot, initial encounter: Secondary | ICD-10-CM | POA: Diagnosis not present

## 2021-03-24 DIAGNOSIS — Z4802 Encounter for removal of sutures: Secondary | ICD-10-CM | POA: Diagnosis not present

## 2021-04-05 ENCOUNTER — Other Ambulatory Visit (HOSPITAL_COMMUNITY): Payer: Self-pay | Admitting: Internal Medicine

## 2021-04-05 DIAGNOSIS — M8580 Other specified disorders of bone density and structure, unspecified site: Secondary | ICD-10-CM

## 2021-04-05 DIAGNOSIS — Z1231 Encounter for screening mammogram for malignant neoplasm of breast: Secondary | ICD-10-CM

## 2021-05-26 ENCOUNTER — Ambulatory Visit (HOSPITAL_COMMUNITY)
Admission: RE | Admit: 2021-05-26 | Discharge: 2021-05-26 | Disposition: A | Discharge status: Home / Self Care | Payer: Medicare PPO | Source: Ambulatory Visit | Attending: Internal Medicine | Admitting: Internal Medicine

## 2021-05-26 ENCOUNTER — Other Ambulatory Visit: Payer: Self-pay

## 2021-05-26 DIAGNOSIS — E1165 Type 2 diabetes mellitus with hyperglycemia: Secondary | ICD-10-CM | POA: Diagnosis not present

## 2021-05-26 DIAGNOSIS — Z1231 Encounter for screening mammogram for malignant neoplasm of breast: Secondary | ICD-10-CM | POA: Diagnosis not present

## 2021-05-26 DIAGNOSIS — M85851 Other specified disorders of bone density and structure, right thigh: Secondary | ICD-10-CM | POA: Insufficient documentation

## 2021-05-26 DIAGNOSIS — M8580 Other specified disorders of bone density and structure, unspecified site: Secondary | ICD-10-CM

## 2021-05-26 DIAGNOSIS — E782 Mixed hyperlipidemia: Secondary | ICD-10-CM | POA: Diagnosis not present

## 2021-05-26 DIAGNOSIS — Z780 Asymptomatic menopausal state: Secondary | ICD-10-CM | POA: Diagnosis not present

## 2021-05-26 DIAGNOSIS — Z13820 Encounter for screening for osteoporosis: Secondary | ICD-10-CM | POA: Diagnosis not present

## 2021-05-26 DIAGNOSIS — E119 Type 2 diabetes mellitus without complications: Secondary | ICD-10-CM | POA: Diagnosis not present

## 2021-05-26 DIAGNOSIS — Z794 Long term (current) use of insulin: Secondary | ICD-10-CM | POA: Insufficient documentation

## 2021-05-31 DIAGNOSIS — E039 Hypothyroidism, unspecified: Secondary | ICD-10-CM | POA: Diagnosis not present

## 2021-05-31 DIAGNOSIS — Z0001 Encounter for general adult medical examination with abnormal findings: Secondary | ICD-10-CM | POA: Diagnosis not present

## 2021-05-31 DIAGNOSIS — E782 Mixed hyperlipidemia: Secondary | ICD-10-CM | POA: Diagnosis not present

## 2021-05-31 DIAGNOSIS — E1165 Type 2 diabetes mellitus with hyperglycemia: Secondary | ICD-10-CM | POA: Diagnosis not present

## 2021-05-31 DIAGNOSIS — R008 Other abnormalities of heart beat: Secondary | ICD-10-CM | POA: Diagnosis not present

## 2021-07-29 ENCOUNTER — Encounter: Payer: Self-pay | Admitting: *Deleted

## 2021-08-01 ENCOUNTER — Telehealth: Payer: Self-pay | Admitting: Cardiology

## 2021-08-01 ENCOUNTER — Encounter: Payer: Self-pay | Admitting: Cardiology

## 2021-08-01 ENCOUNTER — Ambulatory Visit: Payer: Medicare PPO | Admitting: Cardiology

## 2021-08-01 ENCOUNTER — Ambulatory Visit (INDEPENDENT_AMBULATORY_CARE_PROVIDER_SITE_OTHER): Payer: Medicare PPO

## 2021-08-01 ENCOUNTER — Other Ambulatory Visit: Payer: Self-pay | Admitting: Cardiology

## 2021-08-01 VITALS — BP 140/80 | HR 94 | Ht 66.0 in | Wt 190.8 lb

## 2021-08-01 DIAGNOSIS — I491 Atrial premature depolarization: Secondary | ICD-10-CM

## 2021-08-01 DIAGNOSIS — I10 Essential (primary) hypertension: Secondary | ICD-10-CM | POA: Diagnosis not present

## 2021-08-01 DIAGNOSIS — I499 Cardiac arrhythmia, unspecified: Secondary | ICD-10-CM

## 2021-08-01 DIAGNOSIS — I493 Ventricular premature depolarization: Secondary | ICD-10-CM

## 2021-08-01 DIAGNOSIS — E782 Mixed hyperlipidemia: Secondary | ICD-10-CM

## 2021-08-01 NOTE — Progress Notes (Signed)
Cardiology Office Note  Date: 08/01/2021   ID: Beverly Simpson, DOB 12/18/1940, MRN [id number]  PCP:  Benita Stabile, MD  Cardiologist:  Nona Dell, MD Electrophysiologist:  None   Chief Complaint  Patient presents with   Irregular Heart Beat     History of Present Illness: Beverly Simpson is a 80 y.o. female referred for cardiology consultation by Dr. Margo Aye for evaluation of irregular heartbeat.  She is here for evaluation, does not report any sense of palpitations or heart rate irregularity, this was noticed during an office visit with Dr. Margo Aye and apparently by ECG as well with description of PVCs.  I do not have the tracing for review unfortunately.  ECG done in the office today shows sinus rhythm with frequent PACs, leftward axis and low voltage.  I reviewed her history as noted below.  She had lab work done in August which I reviewed.  She reports compliance with her medications, no obvious intolerances.  She does not report any prior documented history of cardiac arrhythmia, no definite atrial fibrillation.   Past Medical History:  Diagnosis Date   Hypothyroidism    Mixed hyperlipidemia    Type 2 diabetes mellitus (HCC)     Past Surgical History:  Procedure Laterality Date   LAPAROSCOPIC CHOLECYSTECTOMY      Current Outpatient Medications  Medication Sig Dispense Refill   insulin aspart (NOVOLOG FLEXPEN) 100 UNIT/ML FlexPen Inject into the skin 3 (three) times daily with meals.     Insulin Degludec-Liraglutide (XULTOPHY) 100-3.6 UNIT-MG/ML SOPN Inject into the skin.     levothyroxine (SYNTHROID) 112 MCG tablet Take 112 mcg by mouth daily before breakfast.     losartan-hydrochlorothiazide (HYZAAR) 100-25 MG tablet Take 1 tablet by mouth daily.     metFORMIN (GLUCOPHAGE) 500 MG tablet Take 1,000 mg by mouth 2 (two) times daily with a meal.     simvastatin (ZOCOR) 40 MG tablet Take 40 mg by mouth daily.     No current facility-administered medications for this visit.    Allergies:  Sulfa antibiotics   Social History: The patient  reports that she has never smoked. She has never used smokeless tobacco. She reports that she does not drink alcohol.   Family History: The patient's family history includes Stroke in her mother.   ROS: No orthopnea or PND.  Physical Exam: VS:  BP 140/80   Pulse 94   Ht 5\' 6"  (1.676 m)   Wt 190 lb 12.8 oz (86.5 kg)   SpO2 97%   BMI 30.80 kg/m , BMI Body mass index is 30.8 kg/m.  Wt Readings from Last 3 Encounters:  08/01/21 190 lb 12.8 oz (86.5 kg)    General: Patient appears comfortable at rest. HEENT: Conjunctiva and lids normal, wearing a mask. Neck: Supple, no elevated JVP or carotid bruits, no thyromegaly. Lungs: Clear to auscultation, nonlabored breathing at rest. Cardiac: RRR with frequent ectopy, no S3, 1/6 systolic murmur, no pericardial rub. Abdomen: Soft, nontender, bowel sounds present, no guarding or rebound. Extremities: No pitting edema, distal pulses 2+. Skin: Warm and dry. Musculoskeletal: No kyphosis. Neuropsychiatric: Alert and oriented x3, affect grossly appropriate.  ECG:   No old tracings for review today.  Recent Labwork:  August 2022: Hemoglobin A1c 7.1%, cholesterol 136, triglycerides 55, HDL 61, LDL 63, BUN 14, creatinine 0.85, potassium 4.3, AST 25, ALT 11, hemoglobin 12.2, platelets 317  Other Studies Reviewed Today:  No prior cardiac testing for review today.  Assessment and Plan:  1.  Irregular heartbeat with ECG showing frequent PACs.  I do not have the tracing obtained to Dr. Scharlene Gloss office.  She does not describe any sense of palpitations.  Plan to obtain a 72-hour Zio patch for further investigation.  Not entirely clear that we need to initiate any specific medical therapy unless she has an inordinately high number of PACs.  Obviously, if she does have intermittent documented atrial fibrillation, this would change our treatment strategy.  2.  Essential hypertension, on  Hyzaar.  3.  Mixed hyperlipidemia, on Zocor with recent LDL 63.  Medication Adjustments/Labs and Tests Ordered: Current medicines are reviewed at length with the patient today.  Concerns regarding medicines are outlined above.   Tests Ordered: Orders Placed This Encounter  Procedures   EKG 12-Lead     Medication Changes: No orders of the defined types were placed in this encounter.   Disposition:  Follow up  test results.  Signed, Jonelle Sidle, MD, [facility] 08/01/2021 3:22 PM  [facility] at [facility] [address] [place], [place], [place] [address] Phone: [phone]; Fax: [phone]

## 2021-08-01 NOTE — Telephone Encounter (Signed)
Pre-cert Verification for the following procedure    72 hour ZIO XT dx: PAC's, irregular heart beat

## 2021-08-01 NOTE — Patient Instructions (Addendum)
Medication Instructions:  ?Your physician recommends that you continue on your current medications as directed. Please refer to the Current Medication list given to you today. ? ?Labwork: ?none ? ?Testing/Procedures: ?ZIO- Long Term Monitor Instructions  ? ?Your physician has requested you wear your ZIO patch monitor 3 days.  ? ?This is a single patch monitor.  [employer] supplies one patch monitor per enrollment.  Additional stickers are not available. ?  ?Please do not apply patch if you will be having a Nuclear Stress Test, Echocardiogram, Cardiac CT, MRI, or Chest Xray during the time frame you would be wearing the monitor. The patch cannot be worn during these tests.  You cannot remove and re-apply the ZIO XT patch monitor. ?  ?  ?Once you have received you monitor, please review enclosed instructions.  Your monitor has already been registered assigning a specific monitor serial # to you. ? ? ?Applying the monitor  ? ?Shave hair from upper left chest. ?  ?Hold abrader disc by orange tab.  Rub abrader in 40 strokes over left upper chest as indicated in your monitor instructions. ?  ?Clean area with 4 enclosed alcohol pads .  Use all pads to assure are is cleaned thoroughly.  Let dry.  ? ?Apply patch as indicated in monitor instructions.  Patch will be place under collarbone on left side of chest with arrow pointing upward. ?  ?Rub patch adhesive wings for 2 minutes.Remove white label marked "1".  Remove white label marked "2".  Rub patch adhesive wings for 2 additional minutes. ?  ?While looking in a mirror, press and release button in center of patch.  A small green light will flash 3-4 times .  This will be your only indicator the monitor has been turned on. ?    ?Do not shower for the first 24 hours.  You may shower after the first 24 hours. ?  ?Press button if you feel a symptom. You will hear a small click.  Record Date, Time and Symptom in the Patient Log Book. ?  ?When you are ready to remove patch, follow  instructions on last 2 pages of Patient Log Book.  Stick patch monitor onto last page of Patient Log Book. ?  ?Place Patient Log Book in Blue box.  Use locking tab on box and tape box closed securely.  The Orange and White box has prepaid postage on it.  Please place in mailbox as soon as possible.  Your physician should have your test results approximately 7 days after the monitor has been mailed back to [employer]. ?  ?Call [employer] Customer Care at [phone] if you have questions regarding your ZIO XT patch monitor.  Call them immediately if you see an orange light blinking on your monitor. ?  ?If your monitor falls off in less than 4 days contact our Monitor department at [phone].  If your monitor becomes loose or falls off after 4 days call [employer] at [phone] for suggestions on securing your monitor. ? ?Follow-Up: ?Your physician recommends that you schedule a follow-up appointment in: pending ? ?Any Other Special Instructions Will Be Listed Below (If Applicable). ? ?If you need a refill on your cardiac medications before your next appointment, please call your pharmacy. ?

## 2021-08-04 DIAGNOSIS — I499 Cardiac arrhythmia, unspecified: Secondary | ICD-10-CM

## 2021-08-04 DIAGNOSIS — I491 Atrial premature depolarization: Secondary | ICD-10-CM

## 2021-08-11 DIAGNOSIS — I491 Atrial premature depolarization: Secondary | ICD-10-CM | POA: Diagnosis not present

## 2021-08-11 DIAGNOSIS — I499 Cardiac arrhythmia, unspecified: Secondary | ICD-10-CM | POA: Diagnosis not present

## 2021-08-12 ENCOUNTER — Telehealth: Payer: Self-pay | Admitting: *Deleted

## 2021-08-12 MED ORDER — METOPROLOL SUCCINATE ER 25 MG PO TB24
25.0000 mg | ORAL_TABLET | Freq: Every day | ORAL | 1 refills | Status: DC
Start: 2021-08-12 — End: 2022-09-01

## 2021-08-12 NOTE — Telephone Encounter (Signed)
Patient returned call to lydia. Please call her on home phone.

## 2021-08-12 NOTE — Telephone Encounter (Signed)
Patient informed and verbalized understanding of plan. Copy sent to PCP 

## 2021-08-12 NOTE — Telephone Encounter (Signed)
-----   Message from Jonelle Sidle, MD sent at 08/12/2021 10:20 AM EDT ----- Results reviewed.  Overall rhythm was normal, but she had frequent PACs representing almost 10% total beats and also multiple episodes of brief PSVT.  In light of this, would suggest starting Toprol-XL 25 mg daily.  We can schedule a 4-month follow-up for surveillance.

## 2021-08-29 DIAGNOSIS — E1165 Type 2 diabetes mellitus with hyperglycemia: Secondary | ICD-10-CM | POA: Diagnosis not present

## 2021-08-29 DIAGNOSIS — E782 Mixed hyperlipidemia: Secondary | ICD-10-CM | POA: Diagnosis not present

## 2021-08-29 DIAGNOSIS — E039 Hypothyroidism, unspecified: Secondary | ICD-10-CM | POA: Diagnosis not present

## 2021-09-02 DIAGNOSIS — E039 Hypothyroidism, unspecified: Secondary | ICD-10-CM | POA: Diagnosis not present

## 2021-09-02 DIAGNOSIS — R809 Proteinuria, unspecified: Secondary | ICD-10-CM | POA: Diagnosis not present

## 2021-09-02 DIAGNOSIS — E1165 Type 2 diabetes mellitus with hyperglycemia: Secondary | ICD-10-CM | POA: Diagnosis not present

## 2021-09-02 DIAGNOSIS — E782 Mixed hyperlipidemia: Secondary | ICD-10-CM | POA: Diagnosis not present

## 2021-09-02 DIAGNOSIS — R008 Other abnormalities of heart beat: Secondary | ICD-10-CM | POA: Diagnosis not present

## 2021-11-22 DIAGNOSIS — H2513 Age-related nuclear cataract, bilateral: Secondary | ICD-10-CM | POA: Diagnosis not present

## 2021-11-22 DIAGNOSIS — H5210 Myopia, unspecified eye: Secondary | ICD-10-CM | POA: Diagnosis not present

## 2021-11-22 DIAGNOSIS — H04123 Dry eye syndrome of bilateral lacrimal glands: Secondary | ICD-10-CM | POA: Diagnosis not present

## 2021-11-22 DIAGNOSIS — E119 Type 2 diabetes mellitus without complications: Secondary | ICD-10-CM | POA: Diagnosis not present

## 2021-11-22 DIAGNOSIS — H52229 Regular astigmatism, unspecified eye: Secondary | ICD-10-CM | POA: Diagnosis not present

## 2021-12-21 DIAGNOSIS — E039 Hypothyroidism, unspecified: Secondary | ICD-10-CM | POA: Diagnosis not present

## 2021-12-21 DIAGNOSIS — E782 Mixed hyperlipidemia: Secondary | ICD-10-CM | POA: Diagnosis not present

## 2021-12-21 DIAGNOSIS — E1165 Type 2 diabetes mellitus with hyperglycemia: Secondary | ICD-10-CM | POA: Diagnosis not present

## 2021-12-26 DIAGNOSIS — E782 Mixed hyperlipidemia: Secondary | ICD-10-CM | POA: Diagnosis not present

## 2021-12-26 DIAGNOSIS — R008 Other abnormalities of heart beat: Secondary | ICD-10-CM | POA: Diagnosis not present

## 2021-12-26 DIAGNOSIS — E039 Hypothyroidism, unspecified: Secondary | ICD-10-CM | POA: Diagnosis not present

## 2021-12-26 DIAGNOSIS — R809 Proteinuria, unspecified: Secondary | ICD-10-CM | POA: Diagnosis not present

## 2021-12-26 DIAGNOSIS — E1165 Type 2 diabetes mellitus with hyperglycemia: Secondary | ICD-10-CM | POA: Diagnosis not present

## 2022-03-13 IMAGING — MG MM DIGITAL SCREENING BILAT W/ TOMO AND CAD
6 of 12 series · 6 of 36 positions shown · non-contrast
Comparison: Previous exam(s).

CLINICAL DATA: Screening.

EXAM:
DIGITAL SCREENING BILATERAL MAMMOGRAM WITH TOMOSYNTHESIS AND CAD
TECHNIQUE: Bilateral screening digital craniocaudal and mediolateral oblique
mammograms were obtained. Bilateral screening digital breast
tomosynthesis was performed. The images were evaluated with
computer-aided detection.

[R CC synth-2D (1 of 2)]
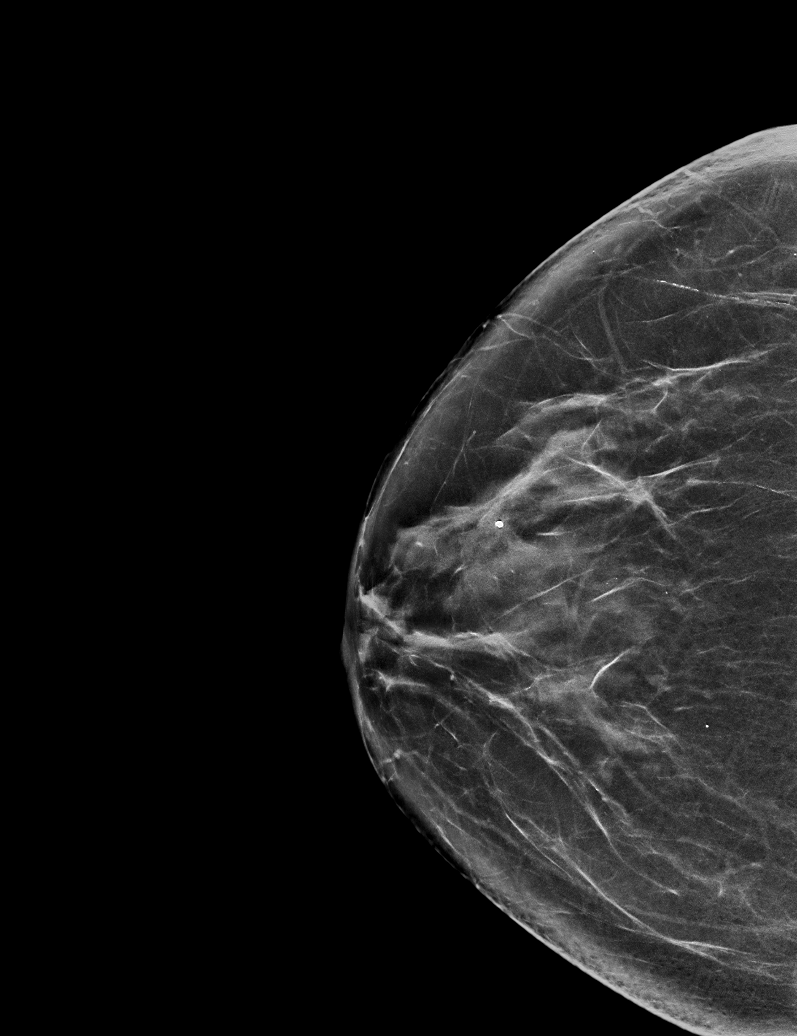

[L MLO synth-2D (1 of 2)]
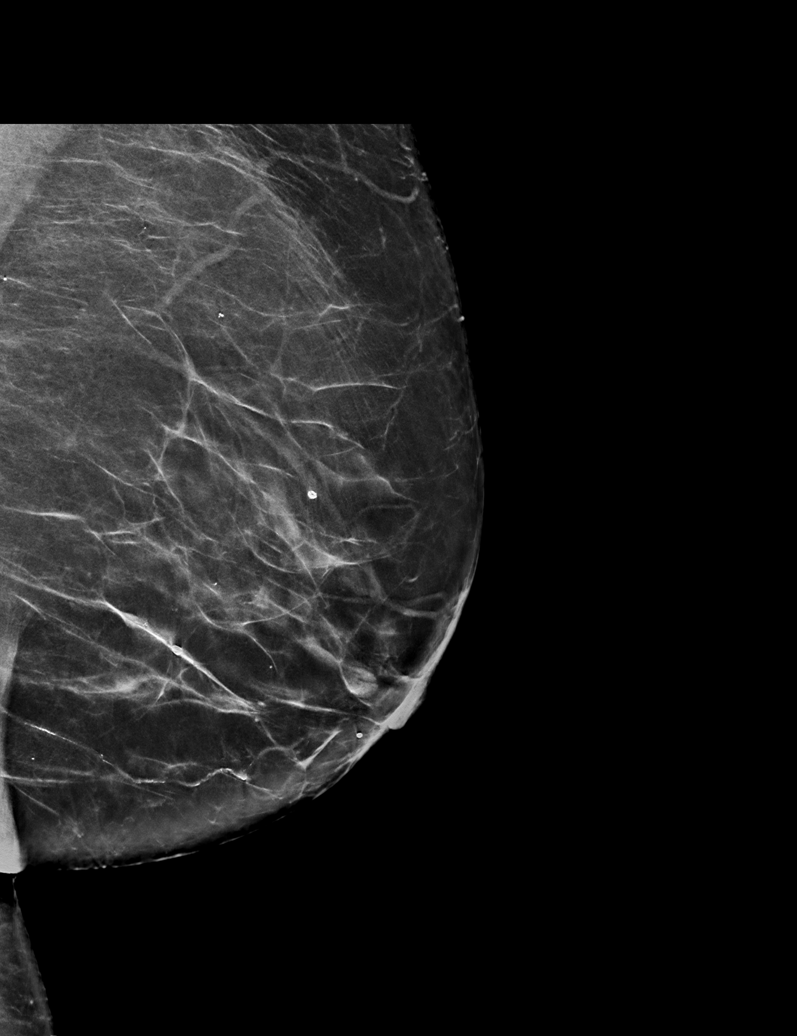

[R CC synth-2D (2 of 2)]
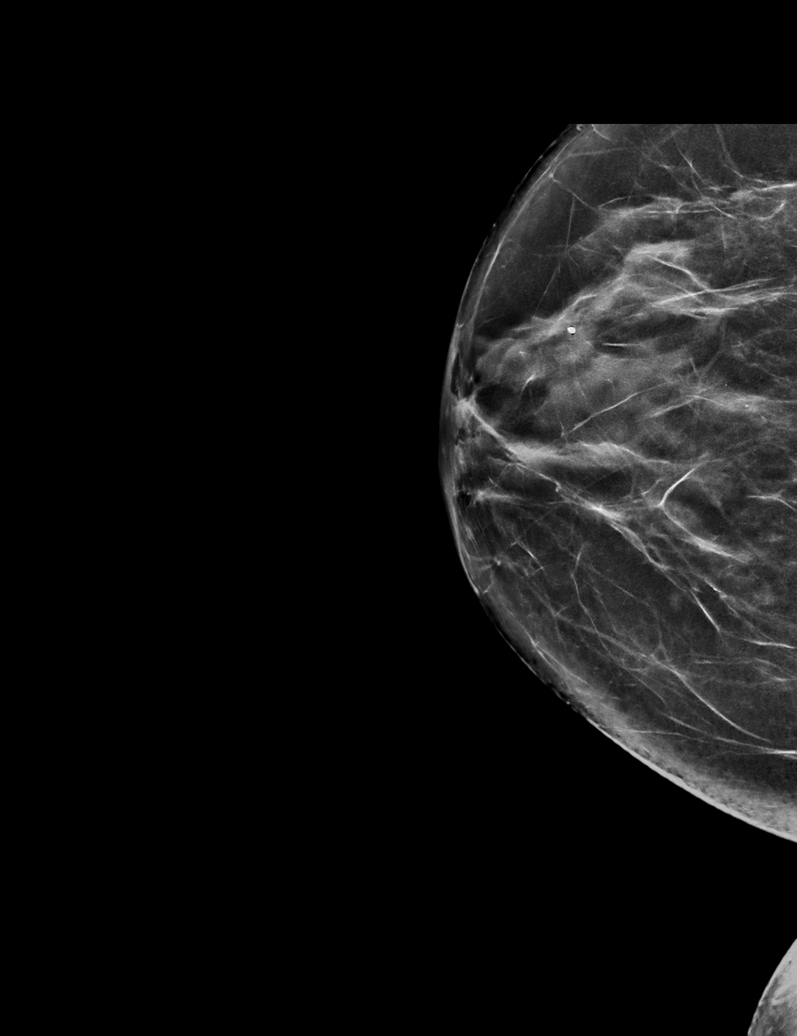

[L MLO synth-2D (2 of 2)]
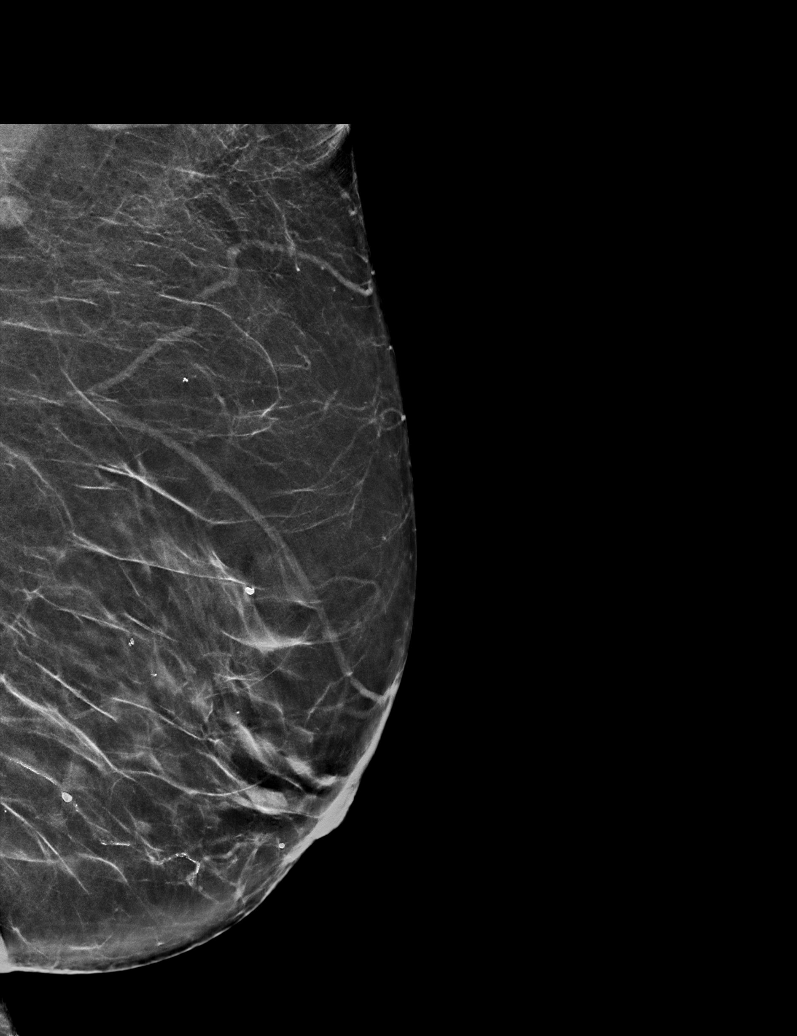

[L CC synth-2D]
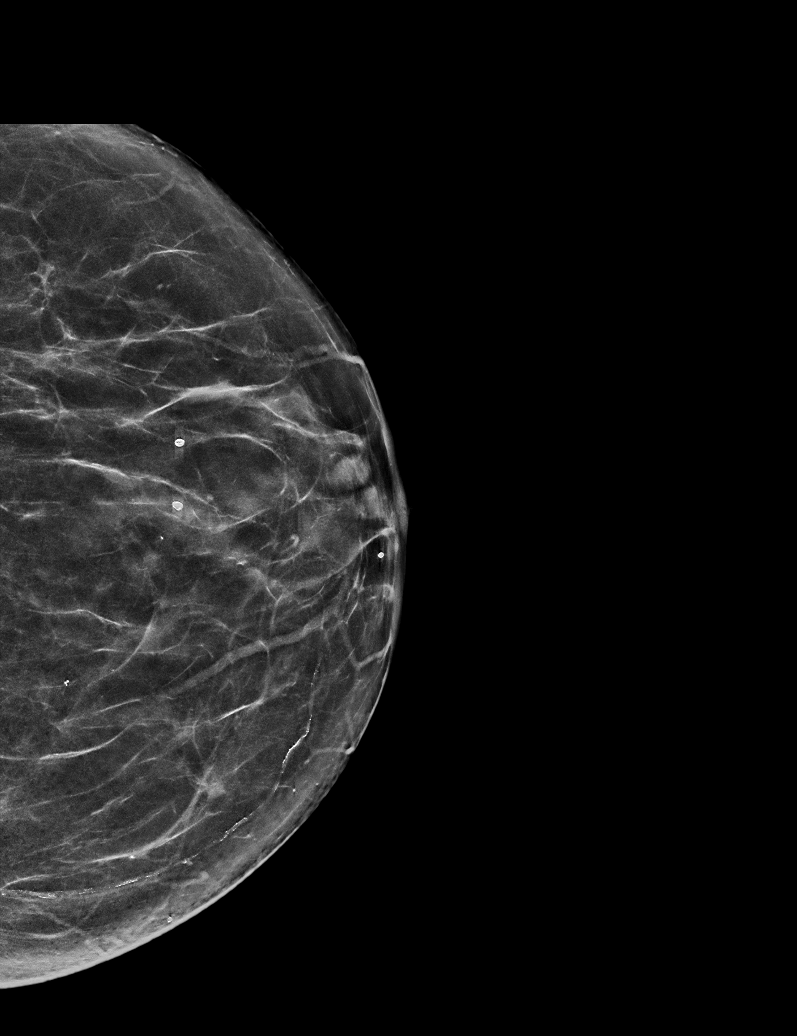

[R MLO synth-2D]
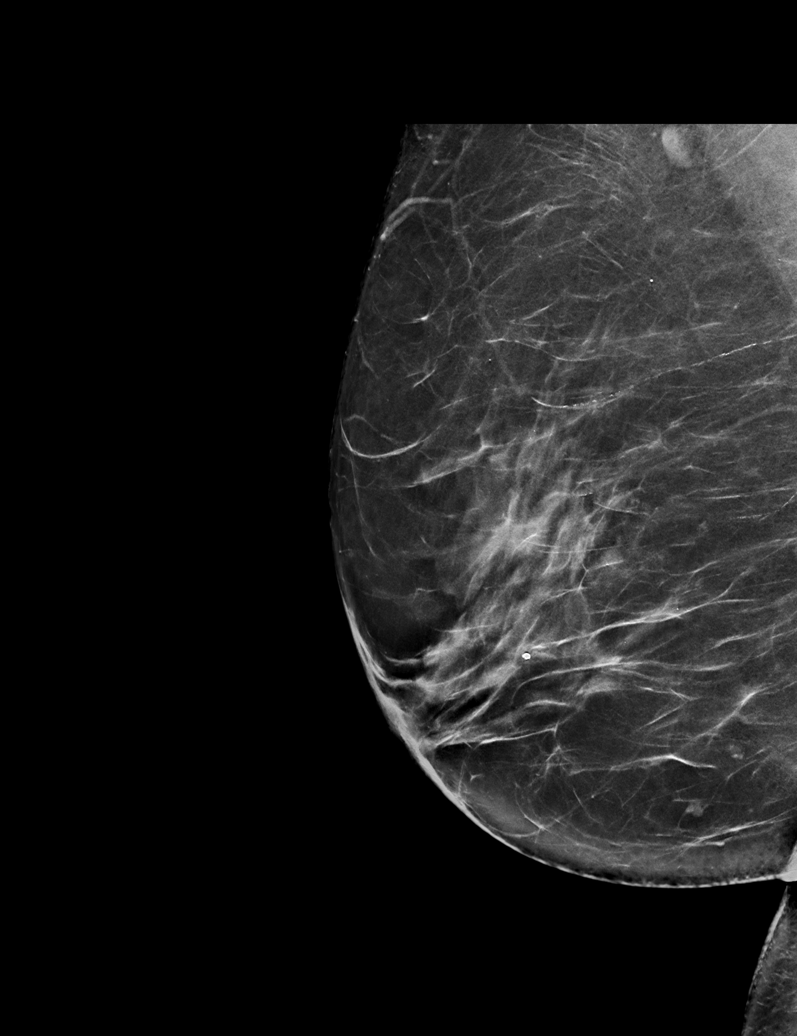

[6 of 36 positions shown; findings below may reference images not displayed]

ACR Breast Density Category c: The breast tissue is heterogeneously
dense, which may obscure small masses.
FINDINGS: There are no findings suspicious for malignancy.
IMPRESSION: No mammographic evidence of malignancy. A result letter of this
screening mammogram will be mailed directly to the patient.

RECOMMENDATION:
Screening mammogram in one year. (Code:Q3-W-BC3)

BI-RADS CATEGORY  1: Negative.

## 2022-04-21 ENCOUNTER — Other Ambulatory Visit (HOSPITAL_COMMUNITY): Payer: Self-pay | Admitting: Internal Medicine

## 2022-04-21 DIAGNOSIS — Z1231 Encounter for screening mammogram for malignant neoplasm of breast: Secondary | ICD-10-CM

## 2022-04-27 DIAGNOSIS — E1165 Type 2 diabetes mellitus with hyperglycemia: Secondary | ICD-10-CM | POA: Diagnosis not present

## 2022-04-27 DIAGNOSIS — E782 Mixed hyperlipidemia: Secondary | ICD-10-CM | POA: Diagnosis not present

## 2022-04-27 DIAGNOSIS — E039 Hypothyroidism, unspecified: Secondary | ICD-10-CM | POA: Diagnosis not present

## 2022-05-01 DIAGNOSIS — R809 Proteinuria, unspecified: Secondary | ICD-10-CM | POA: Diagnosis not present

## 2022-05-01 DIAGNOSIS — E039 Hypothyroidism, unspecified: Secondary | ICD-10-CM | POA: Diagnosis not present

## 2022-05-01 DIAGNOSIS — R008 Other abnormalities of heart beat: Secondary | ICD-10-CM | POA: Diagnosis not present

## 2022-05-01 DIAGNOSIS — E1165 Type 2 diabetes mellitus with hyperglycemia: Secondary | ICD-10-CM | POA: Diagnosis not present

## 2022-05-01 DIAGNOSIS — E782 Mixed hyperlipidemia: Secondary | ICD-10-CM | POA: Diagnosis not present

## 2022-06-08 ENCOUNTER — Ambulatory Visit (HOSPITAL_COMMUNITY)
Admission: RE | Admit: 2022-06-08 | Discharge: 2022-06-08 | Disposition: A | Discharge status: Home / Self Care | Payer: Medicare PPO | Source: Ambulatory Visit | Attending: Internal Medicine | Admitting: Internal Medicine

## 2022-06-08 DIAGNOSIS — Z1231 Encounter for screening mammogram for malignant neoplasm of breast: Secondary | ICD-10-CM | POA: Insufficient documentation

## 2022-08-03 DIAGNOSIS — E1165 Type 2 diabetes mellitus with hyperglycemia: Secondary | ICD-10-CM | POA: Diagnosis not present

## 2022-08-03 DIAGNOSIS — E039 Hypothyroidism, unspecified: Secondary | ICD-10-CM | POA: Diagnosis not present

## 2022-08-03 DIAGNOSIS — E782 Mixed hyperlipidemia: Secondary | ICD-10-CM | POA: Diagnosis not present

## 2022-08-09 DIAGNOSIS — R809 Proteinuria, unspecified: Secondary | ICD-10-CM | POA: Diagnosis not present

## 2022-08-09 DIAGNOSIS — M8580 Other specified disorders of bone density and structure, unspecified site: Secondary | ICD-10-CM | POA: Diagnosis not present

## 2022-08-09 DIAGNOSIS — E039 Hypothyroidism, unspecified: Secondary | ICD-10-CM | POA: Diagnosis not present

## 2022-08-09 DIAGNOSIS — R008 Other abnormalities of heart beat: Secondary | ICD-10-CM | POA: Diagnosis not present

## 2022-08-09 DIAGNOSIS — M629 Disorder of muscle, unspecified: Secondary | ICD-10-CM | POA: Diagnosis not present

## 2022-08-09 DIAGNOSIS — E118 Type 2 diabetes mellitus with unspecified complications: Secondary | ICD-10-CM | POA: Diagnosis not present

## 2022-08-09 DIAGNOSIS — I10 Essential (primary) hypertension: Secondary | ICD-10-CM | POA: Diagnosis not present

## 2022-08-09 DIAGNOSIS — E782 Mixed hyperlipidemia: Secondary | ICD-10-CM | POA: Diagnosis not present

## 2022-08-09 DIAGNOSIS — Z0000 Encounter for general adult medical examination without abnormal findings: Secondary | ICD-10-CM | POA: Diagnosis not present

## 2022-09-01 ENCOUNTER — Ambulatory Visit: Payer: Medicare PPO | Admitting: Obstetrics & Gynecology

## 2022-09-01 ENCOUNTER — Encounter: Payer: Self-pay | Admitting: Obstetrics & Gynecology

## 2022-09-01 VITALS — BP 134/77 | HR 95 | Ht 66.0 in | Wt 180.0 lb

## 2022-09-01 DIAGNOSIS — Z780 Asymptomatic menopausal state: Secondary | ICD-10-CM

## 2022-09-01 DIAGNOSIS — N812 Incomplete uterovaginal prolapse: Secondary | ICD-10-CM

## 2022-09-01 DIAGNOSIS — N952 Postmenopausal atrophic vaginitis: Secondary | ICD-10-CM

## 2022-09-01 NOTE — Progress Notes (Signed)
   GYN VISIT Patient name: Beverly Simpson MRN [id number]  Date of birth: 05-Sep-1941 Chief Complaint:   Referral (Rectocele vs cystocele)  History of Present Illness:   Beverly Simpson is a 81 y.o. G28P1001 PM female being seen today for the following concerns  -For the past few yrs notes that something has dropped.  It will come and go.  At times, will have a hard time emptying her bladder.  Denies splinting.  Notes mixed incontinence.  Up at night once to void.  Denies vaginal bleeding, discharge, itching or odor.  She does note incomplete voiding.  Denies pelvic pain.       No LMP recorded. Patient is postmenopausal.     09/01/2022   10:43 AM  Depression screen PHQ 2/9  Decreased Interest 0  Down, Depressed, Hopeless 0  PHQ - 2 Score 0  Altered sleeping 1  Tired, decreased energy 0  Change in appetite 0  Feeling bad or failure about yourself  0  Trouble concentrating 0  Moving slowly or fidgety/restless 0  Suicidal thoughts 0  PHQ-9 Score 1     Review of Systems:   Pertinent items are noted in HPI Denies fever/chills, dizziness, headaches, visual disturbances, fatigue, shortness of breath, chest pain, abdominal pain, vomiting.  Not sexually active. Pertinent History Reviewed:  Reviewed past medical,surgical, social, obstetrical and family history.  Reviewed problem list, medications and allergies. Physical Assessment:   Vitals:   09/01/22 1036  BP: 134/77  Pulse: 95  Weight: 180 lb (81.6 kg)  Height: 5\' 6"  (1.676 m)  Body mass index is 29.05 kg/m.       Physical Examination:   General appearance: alert, well appearing, and in no distress  Psych: mood appropriate, normal affect  Skin: warm & dry   Cardiovascular: normal heart rate noted  Respiratory: normal respiratory effort, no distress  Abdomen: soft, non-tender   Pelvic: VULVA: normal appearing vulva- no masses or lesions.  Bvisible prolapse noted on initial exam.  VAGINA: normal appearing vagina with normal color, some  flattening/atrophic changes, no lesions, CERVIX: normal appearing cervix without discharge or lesions, UTERUS: uterus is normal size, shape, consistency and nontender, ADNEXA: normal adnexa in size, nontender and no masses.  Stage 2 prolapse appreciated.  Extremities: no calf tenderness bilaterally   Chaperone:    -PESSARY TRIAL -pessary trial completed- trail of milex ring- despite larger ring- device fell out.  Fitted for Pessary 2 1/2 in- pt noted good support  Assessment & Plan:  1) Stage 2 cytocele with uterine prolapse -reviewed conservative vs surgical intervention -questions/concerns were addressed -briefly discussed surgical intervention- which she was not interested in at this time -Fitted for Gelhorn as space-occupying device was needed  []  pessary ordered, pt to return for placement once device has arrived   No orders of the defined types were placed in this encounter.   Return for TBD- await pessary order.   Faith Rogue, DO Attending Obstetrician & Gynecologist, [facility] for Myna Hidalgo, [facility]

## 2022-09-20 ENCOUNTER — Telehealth: Payer: Self-pay | Admitting: Obstetrics & Gynecology

## 2022-09-20 NOTE — Telephone Encounter (Signed)
Called patient to get pessary insertion appointment scheduled. Had to leave a voicemail.

## 2022-09-26 ENCOUNTER — Encounter: Payer: Self-pay | Admitting: Obstetrics & Gynecology

## 2022-09-26 ENCOUNTER — Ambulatory Visit: Payer: Medicare PPO | Admitting: Obstetrics & Gynecology

## 2022-09-26 VITALS — BP 138/79 | HR 99 | Ht 66.0 in | Wt 178.2 lb

## 2022-09-26 DIAGNOSIS — N812 Incomplete uterovaginal prolapse: Secondary | ICD-10-CM

## 2022-09-26 DIAGNOSIS — Z4689 Encounter for fitting and adjustment of other specified devices: Secondary | ICD-10-CM

## 2022-09-26 DIAGNOSIS — Z780 Asymptomatic menopausal state: Secondary | ICD-10-CM | POA: Diagnosis not present

## 2022-09-26 DIAGNOSIS — N952 Postmenopausal atrophic vaginitis: Secondary | ICD-10-CM | POA: Diagnosis not present

## 2022-09-26 NOTE — Progress Notes (Signed)
   GYN VISIT Patient name: Beverly Simpson MRN [id number]  Date of birth: 09-Sep-1941 Chief Complaint:   Pessary Check  History of Present Illness:   Beverly Simpson is a 81 y.o. G46P1001 PM female being seen today for pessary insertion.  Last visit in Nov. Pessary fitting was completed; however, we had to order the device.  She reports no acute complaints or concerns since her last visit.     In review, at her last visit, she noted incomplete emptying and pelvic bulge.  +Mixed incontinence No LMP recorded. Patient is postmenopausal.     09/01/2022   10:43 AM  Depression screen PHQ 2/9  Decreased Interest 0  Down, Depressed, Hopeless 0  PHQ - 2 Score 0  Altered sleeping 1  Tired, decreased energy 0  Change in appetite 0  Feeling bad or failure about yourself  0  Trouble concentrating 0  Moving slowly or fidgety/restless 0  Suicidal thoughts 0  PHQ-9 Score 1     Review of Systems:   Pertinent items are noted in HPI Denies fever/chills, dizziness, headaches, visual disturbances, fatigue, shortness of breath, chest pain, abdominal pain, vomiting. Pertinent History Reviewed:  Reviewed past medical,surgical, social, obstetrical and family history.  Reviewed problem list, medications and allergies. Physical Assessment:   Vitals:   09/26/22 1424  BP: 138/79  Pulse: 99  Weight: 178 lb 3.2 oz (80.8 kg)  Height: 5\' 6"  (1.676 m)  Body mass index is 28.76 kg/m.       Physical Examination:   General appearance: alert, well appearing, and in no distress  Psych: mood appropriate, normal affect  Skin: warm & dry   Cardiovascular: normal heart rate noted  Respiratory: normal respiratory effort, no distress  Abdomen: soft, non-tender   Pelvic: VULVA: normal appearing vulva with no masses, tenderness or lesions, VAGINA: normal appearing vagina with normal color and discharge, no lesions.  Stage 2 cystocele noted.  Gelhorn 2 1/2 placed without difficulty  Extremities: no edema   Chaperone:  pt  declined     Assessment & Plan:  1) POP -pessary placed without difficulty -reviewed potential side effects such as discharge, bleeding or irritation.  Questions/concerns were addressed -plan to f/u in 1 mos   No orders of the defined types were placed in this encounter.   Return in about 4 weeks (around 10/24/2022) for with Dr. 12/23/2022 check.   Dena Billet, DO Attending Obstetrician & Gynecologist, [facility] for [facility]

## 2022-09-27 ENCOUNTER — Encounter: Payer: Self-pay | Admitting: Obstetrics & Gynecology

## 2022-09-27 ENCOUNTER — Telehealth: Payer: Self-pay | Admitting: *Deleted

## 2022-09-27 ENCOUNTER — Ambulatory Visit (INDEPENDENT_AMBULATORY_CARE_PROVIDER_SITE_OTHER): Payer: Medicare PPO | Admitting: Obstetrics & Gynecology

## 2022-09-27 VITALS — BP 141/79 | HR 93 | Ht 66.0 in | Wt 178.0 lb

## 2022-09-27 DIAGNOSIS — N813 Complete uterovaginal prolapse: Secondary | ICD-10-CM

## 2022-09-27 DIAGNOSIS — T839XXA Unspecified complication of genitourinary prosthetic device, implant and graft, initial encounter: Secondary | ICD-10-CM

## 2022-09-27 DIAGNOSIS — N952 Postmenopausal atrophic vaginitis: Secondary | ICD-10-CM

## 2022-09-27 NOTE — Telephone Encounter (Signed)
Pt's pessary fell out this am with a BM. I spoke with Dr. Charlotta Newton. Dr. Charlotta Newton states she needs a bigger size. Pt needs to come in for fitting. Pt voiced understanding. Call transferred to [facility] for appt. JSY

## 2022-09-27 NOTE — Progress Notes (Signed)
   GYN VISIT Patient name: Beverly Simpson MRN [id number]  Date of birth: 06-28-1941 Chief Complaint:   pessary fell out this am  History of Present Illness:   Beverly Simpson is a 81 y.o. G76P1001 PM female being seen today for pessary follow up.  Yesterday, Gelhorn pessary was placed and seemed to be ok.  However, this am when she had a BM the device came out.   No LMP recorded. Patient is postmenopausal.     09/01/2022   10:43 AM  Depression screen PHQ 2/9  Decreased Interest 0  Down, Depressed, Hopeless 0  PHQ - 2 Score 0  Altered sleeping 1  Tired, decreased energy 0  Change in appetite 0  Feeling bad or failure about yourself  0  Trouble concentrating 0  Moving slowly or fidgety/restless 0  Suicidal thoughts 0  PHQ-9 Score 1     Review of Systems:   Pertinent items are noted in HPI Denies fever/chills, dizziness, headaches, visual disturbances, fatigue, shortness of breath, chest pain, abdominal pain, vomiting. Pertinent History Reviewed:  Reviewed past medical,surgical, social, obstetrical and family history.  Reviewed problem list, medications and allergies. Physical Assessment:   Vitals:   09/27/22 1406 09/27/22 1410  BP: (!) 157/78 (!) 141/79  Pulse: 97 93  Weight: 178 lb (80.7 kg)   Height: 5\' 6"  (1.676 m)   Body mass index is 28.73 kg/m.       Physical Examination:   General appearance: alert, well appearing, and in no distress  Psych: mood appropriate, normal affect  Skin: warm & dry   Cardiovascular: normal heart rate noted  Respiratory: normal respiratory effort, no distress  Abdomen: soft, non-tender   Pelvic: VULVA: normal appearing vulva with no masses, tenderness or lesions, VAGINA: normal appearing vagina with flat mucosa.  Upon further examination, noted worsening of prolapse extending beyond introitus.  Attempted to fit for larger Gelhorn; however, the device again came out when she went to the bathroom.  For now [facility] 2/12 placed.  Extremities: no  edema, no calf tenderness bilaterally  Chaperone:  pt declined     Assessment & Plan:  1) Uterine prolapse- concern for complete procidentia -recommended referral to Dr. 4/12 to discuss surgical intervention -for now Gelhorn placed and advised holding device up when needed   Return for TBD.   Florian Buff, DO Attending Obstetrician & Gynecologist, [facility] for [facility]

## 2022-09-27 NOTE — Telephone Encounter (Signed)
Pt states her pessary has fallen out. Please advise.

## 2022-09-29 ENCOUNTER — Telehealth: Payer: Self-pay | Admitting: Obstetrics & Gynecology

## 2022-09-29 NOTE — Telephone Encounter (Signed)
Patient called to let you know that the pessary that was placed on 12/6 is working and she doesn't feel that she needs to be referred to [facility] at the moment. She will keep her f/u here and call if she has any issues.

## 2022-10-31 ENCOUNTER — Ambulatory Visit: Payer: Medicare PPO | Admitting: Obstetrics & Gynecology

## 2022-11-14 DIAGNOSIS — E118 Type 2 diabetes mellitus with unspecified complications: Secondary | ICD-10-CM | POA: Diagnosis not present

## 2022-11-14 DIAGNOSIS — E039 Hypothyroidism, unspecified: Secondary | ICD-10-CM | POA: Diagnosis not present

## 2022-11-14 DIAGNOSIS — E782 Mixed hyperlipidemia: Secondary | ICD-10-CM | POA: Diagnosis not present

## 2022-11-22 DIAGNOSIS — I129 Hypertensive chronic kidney disease with stage 1 through stage 4 chronic kidney disease, or unspecified chronic kidney disease: Secondary | ICD-10-CM | POA: Diagnosis not present

## 2022-11-22 DIAGNOSIS — E1122 Type 2 diabetes mellitus with diabetic chronic kidney disease: Secondary | ICD-10-CM | POA: Diagnosis not present

## 2022-11-22 DIAGNOSIS — E1169 Type 2 diabetes mellitus with other specified complication: Secondary | ICD-10-CM | POA: Diagnosis not present

## 2022-11-22 DIAGNOSIS — Z0001 Encounter for general adult medical examination with abnormal findings: Secondary | ICD-10-CM | POA: Diagnosis not present

## 2022-11-22 DIAGNOSIS — N189 Chronic kidney disease, unspecified: Secondary | ICD-10-CM | POA: Diagnosis not present

## 2022-11-22 DIAGNOSIS — E118 Type 2 diabetes mellitus with unspecified complications: Secondary | ICD-10-CM | POA: Diagnosis not present

## 2022-11-22 DIAGNOSIS — M8580 Other specified disorders of bone density and structure, unspecified site: Secondary | ICD-10-CM | POA: Diagnosis not present

## 2022-11-22 DIAGNOSIS — E039 Hypothyroidism, unspecified: Secondary | ICD-10-CM | POA: Diagnosis not present

## 2022-11-22 DIAGNOSIS — E782 Mixed hyperlipidemia: Secondary | ICD-10-CM | POA: Diagnosis not present

## 2022-11-28 DIAGNOSIS — E119 Type 2 diabetes mellitus without complications: Secondary | ICD-10-CM | POA: Diagnosis not present

## 2022-11-28 DIAGNOSIS — H52229 Regular astigmatism, unspecified eye: Secondary | ICD-10-CM | POA: Diagnosis not present

## 2022-11-28 DIAGNOSIS — H04123 Dry eye syndrome of bilateral lacrimal glands: Secondary | ICD-10-CM | POA: Diagnosis not present

## 2022-11-28 DIAGNOSIS — H5210 Myopia, unspecified eye: Secondary | ICD-10-CM | POA: Diagnosis not present

## 2022-11-28 DIAGNOSIS — H2513 Age-related nuclear cataract, bilateral: Secondary | ICD-10-CM | POA: Diagnosis not present

## 2022-12-18 ENCOUNTER — Encounter: Payer: Self-pay | Admitting: Obstetrics & Gynecology

## 2022-12-18 ENCOUNTER — Ambulatory Visit: Payer: Medicare PPO | Admitting: Obstetrics & Gynecology

## 2022-12-18 VITALS — BP 146/86 | HR 98 | Ht 66.0 in | Wt 175.0 lb

## 2022-12-18 DIAGNOSIS — Z780 Asymptomatic menopausal state: Secondary | ICD-10-CM | POA: Diagnosis not present

## 2022-12-18 DIAGNOSIS — N813 Complete uterovaginal prolapse: Secondary | ICD-10-CM

## 2022-12-18 DIAGNOSIS — N952 Postmenopausal atrophic vaginitis: Secondary | ICD-10-CM | POA: Diagnosis not present

## 2022-12-18 DIAGNOSIS — Z4689 Encounter for fitting and adjustment of other specified devices: Secondary | ICD-10-CM

## 2022-12-18 NOTE — Progress Notes (Signed)
     GYN VISIT Patient name: Beverly Simpson MRN [id number]  Date of birth: 11-26-1940 Chief Complaint:   Pessary maintenance  History of Present Illness:   Beverly Simpson is a 82 y.o. G1P1001 PM female being seen today for pessary maintenance.     She uses a Gelhorn 2.5 She reports no vaginal discharge and no vaginal bleeding.  She notes that it has made a big difference- does not even feel it. Denies any slippage.  Overall happy with the device.   Likert scale(1 not bothersome -5 very bothersome)  :  1  Review of Systems:   Pertinent items are noted in HPI Denies fever/chills, dizziness, headaches, visual disturbances, fatigue, shortness of breath, chest pain, abdominal pain, vomiting, no problems with bowel movements, urination, or intercourse unless otherwise stated above.  Pertinent History Reviewed:  Reviewed past medical,surgical, social, obstetrical and family history.  Reviewed problem list, medications and allergies. Physical Assessment:   Vitals:   12/18/22 1354  BP: (!) 146/86  Pulse: 98  Weight: 175 lb (79.4 kg)  Height: 5' 6"$  (1.676 m)  Body mass index is 28.25 kg/m.       Physical Examination:   General appearance: alert, well appearing, and in no distress  Psych: mood appropriate, normal affect  Skin: warm & dry   Cardiovascular: normal heart rate noted  Respiratory: normal respiratory effort, no distress  Abdomen: soft, non-tender   GU: normal external genitalia.   The pessary is removed. Vagina: Exam reveals no undue vaginal mucosal pressure of breakdown, no discharge and no vaginal bleeding.  Vaginal Epithelial Abnormality Classification System:   0 0    No abnormalities 1    Epithelial erythema 2    Granulation tissue 3    Epithelial break or erosion, 1 cm or less 4    Epithelial break or erosion, 1 cm or greater   Irrigation completed- no vaginal bleeding or abnormalities noted   Extremities: no edema   Chaperone:  Mickey Farber     Assessment & Plan:      ICD-10-CM   1. Pessary maintenance  Z46.89     2. Cystocele with third degree uterine prolapse  N81.3     3. Vaginal atrophy  N95.2     4. Postmenopausal  Z78.0       -doing well with no issues -f/u in 3-3mo  Return for 3-473mopessary follow up (Kobie Matkins or eure).   JeJanyth PupaDO Attending ObHeavenerFaCovenant Medical Centeror [facility]

## 2022-12-20 DIAGNOSIS — H2513 Age-related nuclear cataract, bilateral: Secondary | ICD-10-CM | POA: Diagnosis not present

## 2022-12-20 DIAGNOSIS — H40002 Preglaucoma, unspecified, left eye: Secondary | ICD-10-CM | POA: Diagnosis not present

## 2022-12-20 DIAGNOSIS — H2511 Age-related nuclear cataract, right eye: Secondary | ICD-10-CM | POA: Diagnosis not present

## 2023-01-05 DIAGNOSIS — H40002 Preglaucoma, unspecified, left eye: Secondary | ICD-10-CM | POA: Diagnosis not present

## 2023-01-23 ENCOUNTER — Ambulatory Visit: Payer: Medicare PPO | Admitting: Obstetrics and Gynecology

## 2023-01-30 DIAGNOSIS — H5711 Ocular pain, right eye: Secondary | ICD-10-CM | POA: Diagnosis not present

## 2023-01-30 DIAGNOSIS — H2511 Age-related nuclear cataract, right eye: Secondary | ICD-10-CM | POA: Diagnosis not present

## 2023-02-19 DIAGNOSIS — H2512 Age-related nuclear cataract, left eye: Secondary | ICD-10-CM | POA: Diagnosis not present

## 2023-02-19 DIAGNOSIS — H5712 Ocular pain, left eye: Secondary | ICD-10-CM | POA: Diagnosis not present

## 2023-03-22 ENCOUNTER — Encounter: Payer: Self-pay | Admitting: Obstetrics & Gynecology

## 2023-03-22 ENCOUNTER — Ambulatory Visit: Payer: Medicare PPO | Admitting: Obstetrics & Gynecology

## 2023-03-22 VITALS — BP 139/81 | HR 76 | Ht 66.0 in | Wt 176.2 lb

## 2023-03-22 DIAGNOSIS — N952 Postmenopausal atrophic vaginitis: Secondary | ICD-10-CM | POA: Diagnosis not present

## 2023-03-22 DIAGNOSIS — Z4689 Encounter for fitting and adjustment of other specified devices: Secondary | ICD-10-CM | POA: Diagnosis not present

## 2023-03-22 DIAGNOSIS — E118 Type 2 diabetes mellitus with unspecified complications: Secondary | ICD-10-CM | POA: Diagnosis not present

## 2023-03-22 DIAGNOSIS — N813 Complete uterovaginal prolapse: Secondary | ICD-10-CM | POA: Diagnosis not present

## 2023-03-22 DIAGNOSIS — E039 Hypothyroidism, unspecified: Secondary | ICD-10-CM | POA: Diagnosis not present

## 2023-03-22 DIAGNOSIS — E782 Mixed hyperlipidemia: Secondary | ICD-10-CM | POA: Diagnosis not present

## 2023-03-22 NOTE — Progress Notes (Signed)
    GYN VISIT Patient name: Beverly Simpson MRN [id number]  Date of birth: 30-Oct-1940 Chief Complaint:    Chief Complaint  Patient presents with   Pessary Check   History of Present Illness:   Beverly Simpson is a 82 y.o. G1P1001 PM female being seen today for pessary maintenance.     She uses a Gellhorn 2.5 She reports no vaginal discharge and no vaginal bleeding   Likert scale(1 not bothersome -5 very bothersome)  :  1  Review of Systems:   Pertinent items are noted in HPI Denies fever/chills, dizziness, headaches, visual disturbances, fatigue, shortness of breath, chest pain, abdominal pain, vomiting, no problems with  bowel movements, urination, or intercourse unless otherwise stated above.  Pertinent History Reviewed:  Reviewed past medical,surgical, social, obstetrical and family history.  Reviewed problem list, medications and allergies. Physical Assessment:   Vitals:   03/22/23 0949  BP: 139/81  Pulse: 76  Weight: 176 lb 3.2 oz (79.9 kg)  Height: 5\' 6"  (1.676 m)  Body mass index is 28.44 kg/m.       Physical Examination:   General appearance: alert, well appearing, and in no distress  Psych: mood appropriate, normal affect  Skin: warm & dry   Cardiovascular: normal heart rate noted  Respiratory: normal respiratory effort, no distress  Abdomen: soft, non-tender   GU: normal external genitalia.   The pessary is removed. Vagina: Exam reveals no undue vaginal mucosal pressure of breakdown, no discharge and no vaginal bleeding.  Vaginal Epithelial Abnormality Classification System:   0 0    No abnormalities 1    Epithelial erythema 2    Granulation tissue 3    Epithelial break or erosion, 1 cm or less 4    Epithelial break or erosion, 1 cm or greater   Irrigation completed- no vaginal bleeding or abnormalities noted  Extremities: no edema   Chaperone: Faith Rogue    Assessment & Plan:     ICD-10-CM   1. Pessary maintenance  Z46.89     2. Cystocele with third  degree uterine prolapse  N81.3     3. Vaginal atrophy  N95.2       -Doing great with pessary plan to follow-up every 3 to 6 months   No orders of the defined types were placed in this encounter.   Return in about 3 months (around 06/22/2023) for 3-4 mo pessary check  (Beverly Simpson or Beverly Simpson).   Myna Hidalgo, DO Attending Obstetrician & Gynecologist, [facility] for [employer], [facility]

## 2023-03-28 DIAGNOSIS — I129 Hypertensive chronic kidney disease with stage 1 through stage 4 chronic kidney disease, or unspecified chronic kidney disease: Secondary | ICD-10-CM | POA: Diagnosis not present

## 2023-03-28 DIAGNOSIS — E782 Mixed hyperlipidemia: Secondary | ICD-10-CM | POA: Diagnosis not present

## 2023-03-28 DIAGNOSIS — N189 Chronic kidney disease, unspecified: Secondary | ICD-10-CM | POA: Diagnosis not present

## 2023-03-28 DIAGNOSIS — R008 Other abnormalities of heart beat: Secondary | ICD-10-CM | POA: Diagnosis not present

## 2023-03-28 DIAGNOSIS — E118 Type 2 diabetes mellitus with unspecified complications: Secondary | ICD-10-CM | POA: Diagnosis not present

## 2023-03-28 DIAGNOSIS — M8580 Other specified disorders of bone density and structure, unspecified site: Secondary | ICD-10-CM | POA: Diagnosis not present

## 2023-03-28 DIAGNOSIS — R809 Proteinuria, unspecified: Secondary | ICD-10-CM | POA: Diagnosis not present

## 2023-03-28 DIAGNOSIS — E039 Hypothyroidism, unspecified: Secondary | ICD-10-CM | POA: Diagnosis not present

## 2023-03-28 DIAGNOSIS — E1122 Type 2 diabetes mellitus with diabetic chronic kidney disease: Secondary | ICD-10-CM | POA: Diagnosis not present

## 2023-05-07 ENCOUNTER — Other Ambulatory Visit (HOSPITAL_COMMUNITY): Payer: Self-pay | Admitting: Internal Medicine

## 2023-05-07 DIAGNOSIS — M8580 Other specified disorders of bone density and structure, unspecified site: Secondary | ICD-10-CM

## 2023-05-07 DIAGNOSIS — Z1231 Encounter for screening mammogram for malignant neoplasm of breast: Secondary | ICD-10-CM

## 2023-06-01 ENCOUNTER — Other Ambulatory Visit (HOSPITAL_COMMUNITY): Payer: Medicare PPO

## 2023-06-15 ENCOUNTER — Ambulatory Visit (HOSPITAL_COMMUNITY)
Admission: RE | Admit: 2023-06-15 | Discharge: 2023-06-15 | Disposition: A | Discharge status: Home / Self Care | Payer: Medicare PPO | Source: Ambulatory Visit | Attending: Internal Medicine | Admitting: Internal Medicine

## 2023-06-15 DIAGNOSIS — Z780 Asymptomatic menopausal state: Secondary | ICD-10-CM | POA: Insufficient documentation

## 2023-06-15 DIAGNOSIS — M8580 Other specified disorders of bone density and structure, unspecified site: Secondary | ICD-10-CM

## 2023-06-15 DIAGNOSIS — M85852 Other specified disorders of bone density and structure, left thigh: Secondary | ICD-10-CM | POA: Diagnosis not present

## 2023-06-15 DIAGNOSIS — E119 Type 2 diabetes mellitus without complications: Secondary | ICD-10-CM | POA: Diagnosis not present

## 2023-06-15 DIAGNOSIS — Z794 Long term (current) use of insulin: Secondary | ICD-10-CM | POA: Diagnosis not present

## 2023-06-15 DIAGNOSIS — Z1231 Encounter for screening mammogram for malignant neoplasm of breast: Secondary | ICD-10-CM | POA: Diagnosis not present

## 2023-06-20 ENCOUNTER — Other Ambulatory Visit (HOSPITAL_COMMUNITY): Payer: Self-pay | Admitting: Internal Medicine

## 2023-06-20 DIAGNOSIS — R928 Other abnormal and inconclusive findings on diagnostic imaging of breast: Secondary | ICD-10-CM

## 2023-06-21 ENCOUNTER — Encounter: Payer: Self-pay | Admitting: Obstetrics & Gynecology

## 2023-06-21 ENCOUNTER — Ambulatory Visit: Payer: Medicare PPO | Admitting: Obstetrics & Gynecology

## 2023-06-21 VITALS — BP 143/79 | HR 51 | Ht 66.0 in | Wt 180.6 lb

## 2023-06-21 DIAGNOSIS — E782 Mixed hyperlipidemia: Secondary | ICD-10-CM | POA: Diagnosis not present

## 2023-06-21 DIAGNOSIS — Z780 Asymptomatic menopausal state: Secondary | ICD-10-CM

## 2023-06-21 DIAGNOSIS — E039 Hypothyroidism, unspecified: Secondary | ICD-10-CM | POA: Diagnosis not present

## 2023-06-21 DIAGNOSIS — N813 Complete uterovaginal prolapse: Secondary | ICD-10-CM

## 2023-06-21 DIAGNOSIS — Z4689 Encounter for fitting and adjustment of other specified devices: Secondary | ICD-10-CM | POA: Diagnosis not present

## 2023-06-21 DIAGNOSIS — E118 Type 2 diabetes mellitus with unspecified complications: Secondary | ICD-10-CM | POA: Diagnosis not present

## 2023-06-21 DIAGNOSIS — M8580 Other specified disorders of bone density and structure, unspecified site: Secondary | ICD-10-CM | POA: Diagnosis not present

## 2023-06-21 NOTE — Progress Notes (Signed)
     GYN VISIT Patient name: Beverly Simpson MRN [id number]  Date of birth: December 20, 1940 Chief Complaint:    Chief Complaint  Patient presents with   Pessary Check   History of Present Illness:   Beverly Simpson is a 82 y.o. G8P1001 female being seen today for pessary maintenance.     She uses a Gelhorn 2.5 She reports no vaginal discharge and no vaginal bleeding.  She is very happy with pessary and notes a significant change including when she voids.  No acute complaints or concerns.  Likert scale(1 not bothersome -5 very bothersome)  :  1  Review of Systems:   Pertinent items are noted in HPI Denies fever/chills, dizziness, headaches, visual disturbances, fatigue, shortness of breath, chest pain, abdominal pain, vomiting, no problems bowel movements, urination, or intercourse unless otherwise stated above.  Pertinent History Reviewed:  Reviewed past medical,surgical, social, obstetrical and family history.  Reviewed problem list, medications and allergies. Physical Assessment:   Vitals:   06/21/23 1053  BP: (!) 143/79  Pulse: (!) 51  Weight: 180 lb 9.6 oz (81.9 kg)  Height: 5\' 6"  (1.676 m)  Body mass index is 29.15 kg/m.       Physical Examination:   General appearance: alert, well appearing, and in no distress  Psych: mood appropriate, normal affect  Skin: warm & dry   Cardiovascular: normal heart rate noted  Respiratory: normal respiratory effort, no distress  Abdomen: soft, non-tender   GU: normal external genitalia.   The pessary is removed with ring forceps. Vagina: Exam reveals no undue vaginal mucosal pressure of breakdown, no discharge and no vaginal bleeding.  Normal appearing cervix.  Vaginal Epithelial Abnormality Classification System:   0 0    No abnormalities 1    Epithelial erythema 2    Granulation tissue 3    Epithelial break or erosion, 1 cm or less 4    Epithelial break or erosion, 1 cm or greater   Irrigation completed- no vaginal bleeding or abnormalities  noted   Extremities: no edema   Chaperone: Faith Rogue    Assessment & Plan:     ICD-10-CM   1. Pessary maintenance  Z46.89     2. Cystocele with third degree uterine prolapse  N81.3     3. Postmenopausal  Z78.0       -doing well with pessary -no acute concerns -continue exam q 4 mos  No orders of the defined types were placed in this encounter.   Return in about 4 months (around 10/21/2023) for pessary maintenance (Tiaira Arambula).   Myna Hidalgo, DO Attending Obstetrician & Gynecologist, [facility] for [employer], [facility]

## 2023-06-22 ENCOUNTER — Ambulatory Visit: Payer: Medicare PPO | Admitting: Obstetrics & Gynecology

## 2023-07-02 DIAGNOSIS — N1831 Chronic kidney disease, stage 3a: Secondary | ICD-10-CM | POA: Diagnosis not present

## 2023-07-02 DIAGNOSIS — R008 Other abnormalities of heart beat: Secondary | ICD-10-CM | POA: Diagnosis not present

## 2023-07-02 DIAGNOSIS — I129 Hypertensive chronic kidney disease with stage 1 through stage 4 chronic kidney disease, or unspecified chronic kidney disease: Secondary | ICD-10-CM | POA: Diagnosis not present

## 2023-07-02 DIAGNOSIS — E039 Hypothyroidism, unspecified: Secondary | ICD-10-CM | POA: Diagnosis not present

## 2023-07-02 DIAGNOSIS — R809 Proteinuria, unspecified: Secondary | ICD-10-CM | POA: Diagnosis not present

## 2023-07-02 DIAGNOSIS — E118 Type 2 diabetes mellitus with unspecified complications: Secondary | ICD-10-CM | POA: Diagnosis not present

## 2023-07-02 DIAGNOSIS — E782 Mixed hyperlipidemia: Secondary | ICD-10-CM | POA: Diagnosis not present

## 2023-07-02 DIAGNOSIS — M8580 Other specified disorders of bone density and structure, unspecified site: Secondary | ICD-10-CM | POA: Diagnosis not present

## 2023-07-02 DIAGNOSIS — E1122 Type 2 diabetes mellitus with diabetic chronic kidney disease: Secondary | ICD-10-CM | POA: Diagnosis not present

## 2023-07-03 ENCOUNTER — Ambulatory Visit (HOSPITAL_COMMUNITY)
Admission: RE | Admit: 2023-07-03 | Discharge: 2023-07-03 | Disposition: A | Discharge status: Home / Self Care | Payer: Medicare PPO | Source: Ambulatory Visit | Attending: Internal Medicine | Admitting: Internal Medicine

## 2023-07-03 ENCOUNTER — Other Ambulatory Visit (HOSPITAL_COMMUNITY): Payer: Self-pay | Admitting: Internal Medicine

## 2023-07-03 DIAGNOSIS — R928 Other abnormal and inconclusive findings on diagnostic imaging of breast: Secondary | ICD-10-CM | POA: Diagnosis not present

## 2023-07-03 DIAGNOSIS — N6012 Diffuse cystic mastopathy of left breast: Secondary | ICD-10-CM | POA: Diagnosis not present

## 2023-07-03 DIAGNOSIS — R921 Mammographic calcification found on diagnostic imaging of breast: Secondary | ICD-10-CM | POA: Diagnosis not present

## 2023-07-03 DIAGNOSIS — N6322 Unspecified lump in the left breast, upper inner quadrant: Secondary | ICD-10-CM | POA: Diagnosis not present

## 2023-07-03 DIAGNOSIS — N6002 Solitary cyst of left breast: Secondary | ICD-10-CM | POA: Diagnosis not present

## 2023-07-06 ENCOUNTER — Other Ambulatory Visit (HOSPITAL_COMMUNITY): Payer: Self-pay | Admitting: Internal Medicine

## 2023-07-06 DIAGNOSIS — R928 Other abnormal and inconclusive findings on diagnostic imaging of breast: Secondary | ICD-10-CM

## 2023-07-10 ENCOUNTER — Ambulatory Visit (HOSPITAL_COMMUNITY)
Admission: RE | Admit: 2023-07-10 | Discharge: 2023-07-10 | Disposition: A | Discharge status: Home / Self Care | Payer: Medicare PPO | Source: Ambulatory Visit | Attending: Internal Medicine | Admitting: Internal Medicine

## 2023-07-10 ENCOUNTER — Other Ambulatory Visit: Payer: Self-pay | Admitting: Diagnostic Radiology

## 2023-07-10 ENCOUNTER — Encounter (HOSPITAL_COMMUNITY): Payer: Self-pay

## 2023-07-10 ENCOUNTER — Encounter (HOSPITAL_COMMUNITY): Payer: Medicare PPO

## 2023-07-10 ENCOUNTER — Ambulatory Visit (HOSPITAL_COMMUNITY): Payer: Medicare PPO

## 2023-07-10 DIAGNOSIS — R928 Other abnormal and inconclusive findings on diagnostic imaging of breast: Secondary | ICD-10-CM | POA: Diagnosis present

## 2023-07-10 DIAGNOSIS — N6321 Unspecified lump in the left breast, upper outer quadrant: Secondary | ICD-10-CM | POA: Diagnosis not present

## 2023-07-10 DIAGNOSIS — N6489 Other specified disorders of breast: Secondary | ICD-10-CM | POA: Diagnosis not present

## 2023-07-10 DIAGNOSIS — R921 Mammographic calcification found on diagnostic imaging of breast: Secondary | ICD-10-CM | POA: Insufficient documentation

## 2023-07-10 DIAGNOSIS — N641 Fat necrosis of breast: Secondary | ICD-10-CM | POA: Diagnosis not present

## 2023-07-10 DIAGNOSIS — N6322 Unspecified lump in the left breast, upper inner quadrant: Secondary | ICD-10-CM | POA: Diagnosis present

## 2023-07-10 HISTORY — PX: BREAST BIOPSY: SHX20

## 2023-07-10 MED ORDER — LIDOCAINE HCL (PF) 2 % IJ SOLN
INTRAMUSCULAR | Status: AC
  Filled 2023-07-10: qty 20

## 2023-07-10 MED ORDER — LIDOCAINE-EPINEPHRINE (PF) 1 %-1:200000 IJ SOLN
INTRAMUSCULAR | Status: AC
  Filled 2023-07-10: qty 30

## 2023-07-12 LAB — SURGICAL PATHOLOGY

## 2023-10-04 DIAGNOSIS — E118 Type 2 diabetes mellitus with unspecified complications: Secondary | ICD-10-CM | POA: Diagnosis not present

## 2023-10-04 DIAGNOSIS — E039 Hypothyroidism, unspecified: Secondary | ICD-10-CM | POA: Diagnosis not present

## 2023-10-04 DIAGNOSIS — E782 Mixed hyperlipidemia: Secondary | ICD-10-CM | POA: Diagnosis not present

## 2023-10-04 DIAGNOSIS — E559 Vitamin D deficiency, unspecified: Secondary | ICD-10-CM | POA: Diagnosis not present

## 2023-10-10 ENCOUNTER — Ambulatory Visit: Payer: Medicare PPO | Admitting: Obstetrics & Gynecology

## 2023-10-10 ENCOUNTER — Encounter: Payer: Self-pay | Admitting: Obstetrics & Gynecology

## 2023-10-10 VITALS — BP 135/69 | HR 78 | Ht 66.0 in | Wt 180.4 lb

## 2023-10-10 DIAGNOSIS — Z780 Asymptomatic menopausal state: Secondary | ICD-10-CM

## 2023-10-10 DIAGNOSIS — N813 Complete uterovaginal prolapse: Secondary | ICD-10-CM | POA: Diagnosis not present

## 2023-10-10 DIAGNOSIS — Z4689 Encounter for fitting and adjustment of other specified devices: Secondary | ICD-10-CM

## 2023-10-10 NOTE — Progress Notes (Signed)
     GYN VISIT Patient name: Beverly Simpson MRN [id number]  Date of birth: 03-25-1941 Chief Complaint:    Chief Complaint  Patient presents with   Pessary Check   History of Present Illness:   Beverly Simpson is a 81 y.o. G1P1001 PM female being seen today for pessary maintenance.     She uses a Gelhorn 2.5 She reports no vaginal discharge and no vaginal bleeding   Likert scale(1 not bothersome -5 very bothersome)  :  1  Review of Systems:   Pertinent items are noted in HPI Denies fever/chills, dizziness, headaches, visual disturbances, fatigue, shortness of breath, chest pain, abdominal pain, vomiting, no problems with bowel movements, urination, or intercourse unless otherwise stated above.  Pertinent History Reviewed:  Reviewed past medical,surgical, social, obstetrical and family history.  Reviewed problem list, medications and allergies. Physical Assessment:   Vitals:   10/10/23 0947  BP: 135/69  Pulse: 78  Weight: 180 lb 6.4 oz (81.8 kg)  Height: 5\' 6"  (1.676 m)  Body mass index is 29.12 kg/m.       Physical Examination:   General appearance: alert, well appearing, and in no distress  Psych: mood appropriate, normal affect  Skin: warm & dry   Cardiovascular: normal heart rate noted  Respiratory: normal respiratory effort, no distress  Abdomen: soft, non-tender   GU: normal external genitalia.   The pessary is removed. Vagina: Exam reveals no undue vaginal mucosal pressure of breakdown, no discharge and no vaginal bleeding.  Normal cervix no lesions or masses appreciated  Vaginal Epithelial Abnormality Classification System:   0 0    No abnormalities 1    Epithelial erythema 2    Granulation tissue 3    Epithelial break or erosion, 1 cm or less 4    Epithelial break or erosion, 1 cm or greater   Irrigation completed- no vaginal bleeding or abnormalities noted   Extremities: no edema   Chaperone: Faith Rogue    Assessment & Plan:     ICD-10-CM   1. Pessary  maintenance  Z46.89     2. Cystocele with third degree uterine prolapse  N81.3     3. Postmenopausal  Z78.0       -Doing well with current pessary, reviewed adjustment as needed as needed -Patient not interested in surgical intervention -Follow-up in 3 to 4 months  No orders of the defined types were placed in this encounter.   Return in about 4 months (around 02/08/2024) for pessary maintenance.   Myna Hidalgo, DO Attending Obstetrician & Gynecologist, [facility] for [employer], [facility]

## 2023-10-12 DIAGNOSIS — E118 Type 2 diabetes mellitus with unspecified complications: Secondary | ICD-10-CM | POA: Diagnosis not present

## 2023-10-12 DIAGNOSIS — I10 Essential (primary) hypertension: Secondary | ICD-10-CM | POA: Diagnosis not present

## 2023-10-12 DIAGNOSIS — N1831 Chronic kidney disease, stage 3a: Secondary | ICD-10-CM | POA: Diagnosis not present

## 2023-10-12 DIAGNOSIS — M8580 Other specified disorders of bone density and structure, unspecified site: Secondary | ICD-10-CM | POA: Diagnosis not present

## 2023-10-12 DIAGNOSIS — E039 Hypothyroidism, unspecified: Secondary | ICD-10-CM | POA: Diagnosis not present

## 2023-10-12 DIAGNOSIS — R809 Proteinuria, unspecified: Secondary | ICD-10-CM | POA: Diagnosis not present

## 2023-10-12 DIAGNOSIS — R008 Other abnormalities of heart beat: Secondary | ICD-10-CM | POA: Diagnosis not present

## 2023-10-12 DIAGNOSIS — E782 Mixed hyperlipidemia: Secondary | ICD-10-CM | POA: Diagnosis not present

## 2023-10-12 DIAGNOSIS — E1122 Type 2 diabetes mellitus with diabetic chronic kidney disease: Secondary | ICD-10-CM | POA: Diagnosis not present

## 2023-10-15 ENCOUNTER — Ambulatory Visit: Payer: Medicare PPO | Admitting: Obstetrics & Gynecology

## 2023-11-15 ENCOUNTER — Telehealth: Payer: Self-pay | Admitting: Cardiology

## 2023-11-15 NOTE — Telephone Encounter (Signed)
Called pt in regards to recall for Dr. Diona Browner- pt stated she did not wish to make an apt at this time  Recall deleted

## 2024-01-10 DIAGNOSIS — E559 Vitamin D deficiency, unspecified: Secondary | ICD-10-CM | POA: Diagnosis not present

## 2024-01-10 DIAGNOSIS — E039 Hypothyroidism, unspecified: Secondary | ICD-10-CM | POA: Diagnosis not present

## 2024-01-10 DIAGNOSIS — E118 Type 2 diabetes mellitus with unspecified complications: Secondary | ICD-10-CM | POA: Diagnosis not present

## 2024-01-10 DIAGNOSIS — E782 Mixed hyperlipidemia: Secondary | ICD-10-CM | POA: Diagnosis not present

## 2024-01-17 DIAGNOSIS — I10 Essential (primary) hypertension: Secondary | ICD-10-CM | POA: Diagnosis not present

## 2024-01-17 DIAGNOSIS — R008 Other abnormalities of heart beat: Secondary | ICD-10-CM | POA: Diagnosis not present

## 2024-01-17 DIAGNOSIS — E118 Type 2 diabetes mellitus with unspecified complications: Secondary | ICD-10-CM | POA: Diagnosis not present

## 2024-01-17 DIAGNOSIS — E039 Hypothyroidism, unspecified: Secondary | ICD-10-CM | POA: Diagnosis not present

## 2024-01-17 DIAGNOSIS — E1165 Type 2 diabetes mellitus with hyperglycemia: Secondary | ICD-10-CM | POA: Diagnosis not present

## 2024-01-17 DIAGNOSIS — E782 Mixed hyperlipidemia: Secondary | ICD-10-CM | POA: Diagnosis not present

## 2024-01-17 DIAGNOSIS — E1122 Type 2 diabetes mellitus with diabetic chronic kidney disease: Secondary | ICD-10-CM | POA: Diagnosis not present

## 2024-01-17 DIAGNOSIS — E1169 Type 2 diabetes mellitus with other specified complication: Secondary | ICD-10-CM | POA: Diagnosis not present

## 2024-01-17 DIAGNOSIS — N1831 Chronic kidney disease, stage 3a: Secondary | ICD-10-CM | POA: Diagnosis not present

## 2024-02-07 ENCOUNTER — Encounter: Payer: Self-pay | Admitting: Obstetrics & Gynecology

## 2024-02-07 ENCOUNTER — Ambulatory Visit: Payer: Medicare PPO | Admitting: Obstetrics & Gynecology

## 2024-02-07 VITALS — BP 139/74 | Ht 66.0 in | Wt 180.6 lb

## 2024-02-07 DIAGNOSIS — Z780 Asymptomatic menopausal state: Secondary | ICD-10-CM | POA: Diagnosis not present

## 2024-02-07 DIAGNOSIS — N813 Complete uterovaginal prolapse: Secondary | ICD-10-CM | POA: Diagnosis not present

## 2024-02-07 DIAGNOSIS — N952 Postmenopausal atrophic vaginitis: Secondary | ICD-10-CM | POA: Diagnosis not present

## 2024-02-07 DIAGNOSIS — Z4689 Encounter for fitting and adjustment of other specified devices: Secondary | ICD-10-CM

## 2024-02-07 NOTE — Progress Notes (Signed)
     GYN VISIT Patient name: Beverly Simpson MRN [id number]  Date of birth: [birth year] Chief Complaint:    Chief Complaint  Patient presents with   Pessary Check   History of Present Illness:   Beverly Simpson is a 83 y.o. G1P1001 PM female being seen today for pessary maintenance.     She uses a Gelhorn 2 She reports no vaginal discharge and no vaginal bleeding   Likert scale(1 not bothersome -5 very bothersome)  :  1  Review of Systems:   Pertinent items are noted in HPI Denies fever/chills, dizziness, headaches, visual disturbances, fatigue, shortness of breath, chest pain, abdominal pain, vomiting, no problems with bowel movements, urination, or intercourse unless otherwise stated above.  Pertinent History Reviewed:  Reviewed past medical,surgical, social, obstetrical and family history.  Reviewed problem list, medications and allergies. Physical Assessment:   Vitals:   02/07/24 1006  BP: 139/74  Weight: 180 lb 9.6 oz (81.9 kg)  Height: 5\' 6"  (1.676 m)  Body mass index is 29.15 kg/m.       Physical Examination:   General appearance: alert, well appearing, and in no distress  Psych: mood appropriate, normal affect  Skin: warm & dry   Cardiovascular: normal heart rate noted  Respiratory: normal respiratory effort, no distress  Abdomen: soft, non-tender   GU: normal external genitalia.   The pessary is removed. Vagina: Exam reveals no undue vaginal mucosal pressure of breakdown, no discharge and no vaginal bleeding.  Vaginal Epithelial Abnormality Classification System:   0 0    No abnormalities 1    Epithelial erythema 2    Granulation tissue 3    Epithelial break or erosion, 1 cm or less 4    Epithelial break or erosion, 1 cm or greater   Irrigation completed- no vaginal bleeding or abnormalities noted   Extremities: no edema   Chaperone: Lorean Rodes    Assessment & Plan:     ICD-10-CM   1. Pessary maintenance  Z46.89     2. Cystocele with third degree uterine  prolapse  N81.3     3. Postmenopausal  Z78.0     4. Vaginal atrophy  N95.2       - Doing well with pessary and plan to continue next follow-up in 3 to 4 months  No orders of the defined types were placed in this encounter.   Return for 3-4 mos pessary check.   Zenia Guest, DO Attending Obstetrician & Gynecologist, [facility] for [employer], [facility]

## 2024-04-08 DIAGNOSIS — Z961 Presence of intraocular lens: Secondary | ICD-10-CM | POA: Diagnosis not present

## 2024-04-08 DIAGNOSIS — H04123 Dry eye syndrome of bilateral lacrimal glands: Secondary | ICD-10-CM | POA: Diagnosis not present

## 2024-04-08 DIAGNOSIS — H5213 Myopia, bilateral: Secondary | ICD-10-CM | POA: Diagnosis not present

## 2024-04-08 DIAGNOSIS — E119 Type 2 diabetes mellitus without complications: Secondary | ICD-10-CM | POA: Diagnosis not present

## 2024-04-08 DIAGNOSIS — H52223 Regular astigmatism, bilateral: Secondary | ICD-10-CM | POA: Diagnosis not present

## 2024-04-29 DIAGNOSIS — E039 Hypothyroidism, unspecified: Secondary | ICD-10-CM | POA: Diagnosis not present

## 2024-04-29 DIAGNOSIS — E782 Mixed hyperlipidemia: Secondary | ICD-10-CM | POA: Diagnosis not present

## 2024-04-29 DIAGNOSIS — E559 Vitamin D deficiency, unspecified: Secondary | ICD-10-CM | POA: Diagnosis not present

## 2024-04-29 DIAGNOSIS — E118 Type 2 diabetes mellitus with unspecified complications: Secondary | ICD-10-CM | POA: Diagnosis not present

## 2024-05-06 DIAGNOSIS — N1831 Chronic kidney disease, stage 3a: Secondary | ICD-10-CM | POA: Diagnosis not present

## 2024-05-06 DIAGNOSIS — I129 Hypertensive chronic kidney disease with stage 1 through stage 4 chronic kidney disease, or unspecified chronic kidney disease: Secondary | ICD-10-CM | POA: Diagnosis not present

## 2024-05-06 DIAGNOSIS — E039 Hypothyroidism, unspecified: Secondary | ICD-10-CM | POA: Diagnosis not present

## 2024-05-06 DIAGNOSIS — E1169 Type 2 diabetes mellitus with other specified complication: Secondary | ICD-10-CM | POA: Diagnosis not present

## 2024-05-06 DIAGNOSIS — E1122 Type 2 diabetes mellitus with diabetic chronic kidney disease: Secondary | ICD-10-CM | POA: Diagnosis not present

## 2024-05-06 DIAGNOSIS — E1159 Type 2 diabetes mellitus with other circulatory complications: Secondary | ICD-10-CM | POA: Diagnosis not present

## 2024-05-06 DIAGNOSIS — E782 Mixed hyperlipidemia: Secondary | ICD-10-CM | POA: Diagnosis not present

## 2024-05-06 DIAGNOSIS — E1165 Type 2 diabetes mellitus with hyperglycemia: Secondary | ICD-10-CM | POA: Diagnosis not present

## 2024-05-06 DIAGNOSIS — E118 Type 2 diabetes mellitus with unspecified complications: Secondary | ICD-10-CM | POA: Diagnosis not present

## 2024-05-16 ENCOUNTER — Telehealth: Payer: Self-pay

## 2024-05-16 NOTE — Telephone Encounter (Signed)
 Overdue on lisinopril/hydrochlorothiazide and simvastatin. Unsuccessful outreach today, provided refill reminder. Will check back again next week.

## 2024-05-20 ENCOUNTER — Telehealth: Payer: Self-pay

## 2024-05-20 NOTE — Telephone Encounter (Signed)
 Patient confirmed she has an oversupply of losartan/hydrochlorothiazide and simvastatin, has more than 180 days of both. She'd like to finish up her current supply before refilling

## 2024-05-28 ENCOUNTER — Encounter: Payer: Self-pay | Admitting: Obstetrics & Gynecology

## 2024-05-28 ENCOUNTER — Ambulatory Visit: Admitting: Obstetrics & Gynecology

## 2024-05-28 VITALS — BP 122/63 | Ht 66.0 in | Wt 180.0 lb

## 2024-05-28 DIAGNOSIS — N813 Complete uterovaginal prolapse: Secondary | ICD-10-CM | POA: Diagnosis not present

## 2024-05-28 DIAGNOSIS — Z780 Asymptomatic menopausal state: Secondary | ICD-10-CM

## 2024-05-28 DIAGNOSIS — Z4689 Encounter for fitting and adjustment of other specified devices: Secondary | ICD-10-CM | POA: Diagnosis not present

## 2024-05-28 NOTE — Progress Notes (Signed)
    GYN VISIT Patient name: Beverly Simpson MRN [id number]  Date of birth: [birth year] Chief Complaint:    Chief Complaint  Patient presents with   Pessary Check   History of Present Illness:   Beverly Simpson is a 83 y.o. G1P1001 PM female being seen today for pessary maintenance.     She uses a Gelhorn 2 She reports little vaginal discharge and no vaginal bleeding.  Denies vaginal itching or odor.  Overall doing well and reports no acute complaints or concerns  Likert scale(1 not bothersome -5 very bothersome)  :  1  Review of Systems:   Pertinent items are noted in HPI Denies fever/chills, dizziness, headaches, visual disturbances, fatigue, shortness of breath, chest pain, abdominal pain, vomiting Pertinent History Reviewed:  Reviewed past medical,surgical, social, obstetrical and family history.  Reviewed problem list, medications and allergies. Physical Assessment:   Vitals:   05/28/24 1448  BP: 122/63  Weight: 180 lb (81.6 kg)  Height: 5' 6 (1.676 m)  Body mass index is 29.05 kg/m.       Physical Examination:   General appearance: alert, well appearing, and in no distress  Psych: mood appropriate, normal affect  Skin: warm & dry   Cardiovascular: normal heart rate noted  Respiratory: normal respiratory effort, no distress  Abdomen: soft, non-tender   GU: normal external genitalia.   The pessary is removed. Vagina: Exam reveals no undue vaginal mucosal pressure of breakdown, no discharge and minimal vaginal bleeding.  Vaginal Epithelial Abnormality Classification System:   0 0    No abnormalities 1    Epithelial erythema 2    Granulation tissue 3    Epithelial break or erosion, 1 cm or less 4    Epithelial break or erosion, 1 cm or greater   Irrigation completed- no vaginal bleeding or abnormalities noted   Extremities: no edema   Chaperone: Aleck Blase    Assessment & Plan:     ICD-10-CM   1. Pessary maintenance  Z46.89     2. Cystocele with third degree  uterine prolapse  N81.3     3. Postmenopausal  Z78.0       - Doing well with current pessary - Removed and replaced without issues - Follow-up in 4 months  No orders of the defined types were placed in this encounter.   Return for 3-20mo pessary follow up.   Sedric Guia, DO Attending Obstetrician & Gynecologist, [facility] for [employer], [facility]

## 2024-08-11 DIAGNOSIS — E118 Type 2 diabetes mellitus with unspecified complications: Secondary | ICD-10-CM | POA: Diagnosis not present

## 2024-08-11 DIAGNOSIS — E039 Hypothyroidism, unspecified: Secondary | ICD-10-CM | POA: Diagnosis not present

## 2024-08-11 DIAGNOSIS — E559 Vitamin D deficiency, unspecified: Secondary | ICD-10-CM | POA: Diagnosis not present

## 2024-08-11 DIAGNOSIS — E782 Mixed hyperlipidemia: Secondary | ICD-10-CM | POA: Diagnosis not present

## 2024-08-15 DIAGNOSIS — E118 Type 2 diabetes mellitus with unspecified complications: Secondary | ICD-10-CM | POA: Diagnosis not present

## 2024-08-15 DIAGNOSIS — N814 Uterovaginal prolapse, unspecified: Secondary | ICD-10-CM | POA: Diagnosis not present

## 2024-08-15 DIAGNOSIS — M8580 Other specified disorders of bone density and structure, unspecified site: Secondary | ICD-10-CM | POA: Diagnosis not present

## 2024-08-15 DIAGNOSIS — R008 Other abnormalities of heart beat: Secondary | ICD-10-CM | POA: Diagnosis not present

## 2024-08-15 DIAGNOSIS — E782 Mixed hyperlipidemia: Secondary | ICD-10-CM | POA: Diagnosis not present

## 2024-08-15 DIAGNOSIS — I129 Hypertensive chronic kidney disease with stage 1 through stage 4 chronic kidney disease, or unspecified chronic kidney disease: Secondary | ICD-10-CM | POA: Diagnosis not present

## 2024-08-15 DIAGNOSIS — E039 Hypothyroidism, unspecified: Secondary | ICD-10-CM | POA: Diagnosis not present

## 2024-08-15 DIAGNOSIS — R809 Proteinuria, unspecified: Secondary | ICD-10-CM | POA: Diagnosis not present

## 2024-08-15 DIAGNOSIS — Z0001 Encounter for general adult medical examination with abnormal findings: Secondary | ICD-10-CM | POA: Diagnosis not present

## 2024-09-25 ENCOUNTER — Ambulatory Visit: Admitting: Obstetrics & Gynecology

## 2024-09-25 ENCOUNTER — Encounter: Payer: Self-pay | Admitting: Obstetrics & Gynecology

## 2024-09-25 VITALS — BP 139/86 | HR 80 | Ht 66.0 in | Wt 175.8 lb

## 2024-09-25 DIAGNOSIS — Z4689 Encounter for fitting and adjustment of other specified devices: Secondary | ICD-10-CM

## 2024-09-25 DIAGNOSIS — N952 Postmenopausal atrophic vaginitis: Secondary | ICD-10-CM

## 2024-09-25 DIAGNOSIS — Z780 Asymptomatic menopausal state: Secondary | ICD-10-CM

## 2024-09-25 DIAGNOSIS — N812 Incomplete uterovaginal prolapse: Secondary | ICD-10-CM

## 2024-09-25 NOTE — Progress Notes (Signed)
     GYN VISIT Patient name: Beverly Simpson MRN [id number]  Date of birth: 1940-10-29 Chief Complaint:    Chief Complaint  Patient presents with   Pessary Check   History of Present Illness:   Beverly Simpson is a 83 y.o. G1P1001 PM female being seen today for pessary maintenance.     She uses a Gelhorn She reports no vaginal discharge and no vaginal bleeding   Likert scale(1 not bothersome -5 very bothersome)  :  1  Review of Systems:   Pertinent items are noted in HPI Denies fever/chills, dizziness, headaches, visual disturbances, fatigue, shortness of breath, chest pain, abdominal pain, vomiting, no problems with bowel movements, urination, or intercourse unless otherwise stated above.  Pertinent History Reviewed:  Reviewed past medical,surgical, social, obstetrical and family history.  Reviewed problem list, medications and allergies. Physical Assessment:   Vitals:   09/25/24 1123  BP: 139/86  Pulse: 80  Weight: 175 lb 12.8 oz (79.7 kg)  Height: 5' 6 (1.676 m)  Body mass index is 28.37 kg/m.       Physical Examination:   General appearance: alert, well appearing, and in no distress  Psych: mood appropriate, normal affect  Skin: warm & dry   Cardiovascular: normal heart rate noted  Respiratory: normal respiratory effort, no distress  Abdomen: soft, non-tender   GU: normal external genitalia.   The pessary is removed. Vagina: Exam reveals no undue vaginal mucosal pressure of breakdown, no discharge and no vaginal bleeding.  Vaginal Epithelial Abnormality Classification System:   1 -small area of of irritation noted at cervix around 2:00 0    No abnormalities 1    Epithelial erythema 2    Granulation tissue 3    Epithelial break or erosion, 1 cm or less 4    Epithelial break or erosion, 1 cm or greater   Irrigation completed- no vaginal bleeding.  Device replaced without difficulty  Extremities: no edema   Chaperone: Alan Fischer    Assessment & Plan:      ICD-10-CM   1. Pessary maintenance  Z46.89     2. Cystocele with second degree uterine prolapse  N81.2     3. Postmenopausal  Z78.0     4. Vaginal atrophy  N95.2       - Doing well with pessary no acute changes  No orders of the defined types were placed in this encounter.   Return in about 4 months (around 01/24/2025) for pessary maintenance.   Daianna Vasques, DO Attending Obstetrician & Gynecologist, [facility] for [employer], [facility]
